# Patient Record
Sex: Male | Born: 1955 | Hispanic: No | Marital: Married | Smoking: Never smoker
Health system: Southern US, Community
[De-identification: ages and names within clinical notes are randomized; demographics above are authoritative.]

## PROBLEM LIST (undated history)

## (undated) DIAGNOSIS — I10 Essential (primary) hypertension: Secondary | ICD-10-CM

## (undated) DIAGNOSIS — I2510 Atherosclerotic heart disease of native coronary artery without angina pectoris: Secondary | ICD-10-CM

## (undated) DIAGNOSIS — E785 Hyperlipidemia, unspecified: Secondary | ICD-10-CM

## (undated) HISTORY — DX: Hyperlipidemia, unspecified: E78.5

## (undated) HISTORY — DX: Essential (primary) hypertension: I10

## (undated) HISTORY — DX: Atherosclerotic heart disease of native coronary artery without angina pectoris: I25.10

---

## 2011-09-02 DIAGNOSIS — I10 Essential (primary) hypertension: Secondary | ICD-10-CM | POA: Insufficient documentation

## 2015-07-22 DIAGNOSIS — M67472 Ganglion, left ankle and foot: Secondary | ICD-10-CM | POA: Insufficient documentation

## 2016-05-17 DIAGNOSIS — E876 Hypokalemia: Secondary | ICD-10-CM | POA: Insufficient documentation

## 2017-01-12 DIAGNOSIS — N529 Male erectile dysfunction, unspecified: Secondary | ICD-10-CM | POA: Insufficient documentation

## 2017-12-05 DIAGNOSIS — R972 Elevated prostate specific antigen [PSA]: Secondary | ICD-10-CM | POA: Insufficient documentation

## 2018-05-20 DIAGNOSIS — E782 Mixed hyperlipidemia: Secondary | ICD-10-CM | POA: Insufficient documentation

## 2019-04-28 ENCOUNTER — Other Ambulatory Visit: Payer: Self-pay

## 2019-04-28 ENCOUNTER — Encounter (HOSPITAL_COMMUNITY): Payer: Self-pay

## 2019-04-29 ENCOUNTER — Encounter (HOSPITAL_COMMUNITY): Payer: Self-pay | Admitting: Hematology

## 2019-04-29 ENCOUNTER — Inpatient Hospital Stay (HOSPITAL_COMMUNITY): Payer: 59

## 2019-04-29 ENCOUNTER — Inpatient Hospital Stay (HOSPITAL_COMMUNITY): Payer: 59 | Attending: Hematology | Admitting: Hematology

## 2019-04-29 VITALS — BP 160/85 | HR 86 | Temp 98.3°F | Resp 18 | Ht 66.0 in | Wt 181.5 lb

## 2019-04-29 DIAGNOSIS — D472 Monoclonal gammopathy: Secondary | ICD-10-CM | POA: Insufficient documentation

## 2019-04-29 DIAGNOSIS — E785 Hyperlipidemia, unspecified: Secondary | ICD-10-CM | POA: Insufficient documentation

## 2019-04-29 DIAGNOSIS — Z79899 Other long term (current) drug therapy: Secondary | ICD-10-CM | POA: Diagnosis not present

## 2019-04-29 DIAGNOSIS — I10 Essential (primary) hypertension: Secondary | ICD-10-CM | POA: Diagnosis not present

## 2019-04-29 LAB — HEPATITIS C ANTIBODY: HCV Ab: NONREACTIVE

## 2019-04-29 LAB — HEPATITIS B CORE ANTIBODY, TOTAL: Hep B Core Total Ab: NONREACTIVE

## 2019-04-29 LAB — HEPATITIS B SURFACE ANTIGEN: Hepatitis B Surface Ag: NONREACTIVE

## 2019-04-29 LAB — HEPATITIS B SURFACE ANTIBODY,QUALITATIVE: Hep B S Ab: NONREACTIVE

## 2019-04-29 LAB — LACTATE DEHYDROGENASE: LDH: 152 U/L (ref 98–192)

## 2019-04-29 NOTE — Assessment & Plan Note (Signed)
1.  Monoclonal gammopathy: -Recent blood work on 04/21/2019 by his PMD showed elevated total protein of 8.5. -SPEP was ordered which showed 2.08 g of M spike. -Kappa light chains were 8.78, lambda light chains are 261.42 with ratio of 0.03.  Calcium and creatinine were normal.  Hemoglobin was 14.8. -He denies any fevers, night sweats or weight loss in the last 6 months.  He reports upper arm pain on exercising. -He works as a Air traffic controller at [employer].  He had exposure to fixol and degreaser.  No pesticide exposure.  No family history of malignancies. -Given the M spike of 2 g, multiple myeloma should be ruled out.  We will order serum immunofixation to type monoclonal protein.  Will also order serum viscosity, LDH, beta-2 microglobulin. -We will send 24-hour urine for protein, UPEP and urine immunofixation. -I have also recommended bone marrow aspiration and biopsy.  We will also obtain a whole-body PET CT scan.

## 2019-04-29 NOTE — Patient Instructions (Signed)
[place] at [facility] Discharge Instructions  You were seen today by Dr. Delton Coombes. He went over your history, family history and how you've been feeling lately. He will schedule you for a Bone Marrow Biopsy. He will schedule you for a PET scan as well. He will have blood drawn today. He will see you back after your scan and biopsy for follow up.   Thank you for choosing [place] at [facility] to provide your oncology and hematology care.  To afford each patient quality time with our provider, please arrive at least 15 minutes before your scheduled appointment time.   If you have a lab appointment with the [place] please come in thru the  Main Entrance and check in at the main information desk  You need to re-schedule your appointment should you arrive 10 or more minutes late.  We strive to give you quality time with our providers, and arriving late affects you and other patients whose appointments are after yours.  Also, if you no show three or more times for appointments you may be dismissed from the clinic at the providers discretion.     Again, thank you for choosing [facility].  Our hope is that these requests will decrease the amount of time that you wait before being seen by our physicians.       _____________________________________________________________  Should you have questions after your visit to [facility], please contact our office at [phone] between the hours of 8:00 a.m. and 4:30 p.m.  Voicemails left after 4:00 p.m. will not be returned until the following business day.  For prescription refill requests, have your pharmacy contact our office and allow 72 hours.    Cancer Center Support Programs:   > Cancer Support Group  2nd Tuesday of the month 1pm-2pm, Journey Room

## 2019-04-29 NOTE — Progress Notes (Signed)
[facility] NOTE  No care team member to display  CHIEF COMPLAINTS/PURPOSE OF CONSULTATION:  Monoclonal gammopathy.  HISTORY OF PRESENTING ILLNESS:  Walter Perez 63 y.o. male is seen in consultation today at the request of Dr. Daron Offer for further work-up and management of plasma cell disorder.  Routine blood work on 04/21/2019 showed elevated total protein of 8.5.  This prompted to check for SPEP which showed 2.08 g of M spike.  Lambda light chains were elevated at 261.42 and kappa light chains were normal at 8.78.  Ratio was abnormal at 0.03.  Calcium was 9.5.,  Creatinine of 0.98 and a normal hemoglobin of 14.8.  He denies any fevers, night sweats or weight loss.  He reported upper arm pain on exercising.  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  Denies any recurrent infections.  Denies any new onset pains.  No family history of malignancies.  He reports work-related exposure to Theme park manager.  Denies any exposure to pesticides.  Reports household use of Roundup.  MEDICAL HISTORY:  Past Medical History:  Diagnosis Date  . Hyperlipidemia   . Hypertension     SURGICAL HISTORY: History reviewed. No pertinent surgical history.  SOCIAL HISTORY: Social History   Socioeconomic History  . Marital status: Married    Spouse name: Not on file  . Number of children: 2  . Years of education: Not on file  . Highest education level: Not on file  Occupational History  . Occupation: Unemployed  Social Needs  . Financial resource strain: Not on file  . Food insecurity    Worry: Not on file    Inability: Not on file  . Transportation needs    Medical: Not on file    Non-medical: Not on file  Tobacco Use  . Smoking status: Never Smoker  Substance and Sexual Activity  . Alcohol use: Never    Frequency: Never  . Drug use: Never  . Sexual activity: Yes  Lifestyle  . Physical activity    Days per week: Not on  file    Minutes per session: Not on file  . Stress: Not on file  Relationships  . Social Herbalist on phone: Not on file    Gets together: Not on file    Attends religious service: Not on file    Active member of club or organization: Not on file    Attends meetings of clubs or organizations: Not on file    Relationship status: Not on file  . Intimate partner violence    Fear of current or ex partner: Not on file    Emotionally abused: Not on file    Physically abused: Not on file    Forced sexual activity: Not on file  Other Topics Concern  . Not on file  Social History Narrative  . Not on file    FAMILY HISTORY: Family History  Problem Relation Age of Onset  . Stroke Mother   . Stroke Father     ALLERGIES:  has no allergies on file.  MEDICATIONS:  Current Outpatient Medications  Medication Sig Dispense Refill  . atorvastatin (LIPITOR) 80 MG tablet Take by mouth.    . olmesartan-hydrochlorothiazide (BENICAR HCT) 40-25 MG tablet Take 1 tablet by mouth once daily for high blood pressure    . sildenafil (VIAGRA) 100 MG tablet TAKE _0 TABLET BY MOUTH DAILY AS NEEDED FOR UP TO  30 DAYS FOR ERECTILE DYSFUNCTION. TAKE 1 HOUR BEFORE INTERCOURSE     No current facility-administered medications for this visit.     REVIEW OF SYSTEMS:   Constitutional: Denies fevers, chills or abnormal night sweats Eyes: Denies blurriness of vision, double vision or watery eyes Ears, nose, mouth, throat, and face: Denies mucositis or sore throat Respiratory: Denies cough, dyspnea or wheezes Cardiovascular: Denies palpitation, chest discomfort or lower extremity swelling Gastrointestinal:  Denies nausea, heartburn or change in bowel habits Skin: Denies abnormal skin rashes Lymphatics: Denies new lymphadenopathy or easy bruising Neurological:Denies numbness, tingling or new weaknesses Behavioral/Psych: Mood is stable, no new changes  All other systems were reviewed with the patient  and are negative.  PHYSICAL EXAMINATION: ECOG PERFORMANCE STATUS: 0 - Asymptomatic  Vitals:   04/29/19 0735  BP: (!) 160/85  Pulse: 86  Resp: 18  Temp: 98.3 F (36.8 C)  SpO2: 99%   Filed Weights   04/29/19 0735  Weight: 181 lb 8 oz (82.3 kg)    GENERAL:alert, no distress and comfortable SKIN: skin color, texture, turgor are normal, no rashes or significant lesions EYES: normal, conjunctiva are pink and non-injected, sclera clear OROPHARYNX:no exudate, no erythema and lips, buccal mucosa, and tongue normal  NECK: supple, thyroid normal size, non-tender, without nodularity LYMPH:  no palpable lymphadenopathy in the cervical, axillary or inguinal LUNGS: clear to auscultation and percussion with normal breathing effort HEART: regular rate & rhythm and no murmurs and no lower extremity edema ABDOMEN:abdomen soft, non-tender and normal bowel sounds Musculoskeletal:no cyanosis of digits and no clubbing  PSYCH: alert & oriented x 3 with fluent speech NEURO: no focal motor/sensory deficits  LABORATORY DATA:  I have reviewed the data as listed No results found for: WBC, HGB, HCT, MCV, PLT   Chemistry   No results found for: NA, K, CL, CO2, BUN, CREATININE, GLU No results found for: CALCIUM, ALKPHOS, AST, ALT, BILITOT     RADIOGRAPHIC STUDIES: I have personally reviewed the radiological images as listed and agreed with the findings in the report.  ASSESSMENT & PLAN:  Monoclonal gammopathy 1.  Monoclonal gammopathy: -Recent blood work on 04/21/2019 by his PMD showed elevated total protein of 8.5. -SPEP was ordered which showed 2.08 g of M spike. -Kappa light chains were 8.78, lambda light chains are 261.42 with ratio of 0.03.  Calcium and creatinine were normal.  Hemoglobin was 14.8. -He denies any fevers, night sweats or weight loss in the last 6 months.  He reports upper arm pain on exercising. 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  No pesticide exposure.  No family history of malignancies. -Given the M spike of 2 g, multiple myeloma should be ruled out.  We will order serum immunofixation to type monoclonal protein.  Will also order serum viscosity, LDH, beta-2 microglobulin. -We will send 24-hour urine for protein, UPEP and urine immunofixation. -I have also recommended bone marrow aspiration and biopsy.  We will also obtain a whole-body PET CT scan.  Orders Placed This Encounter  Procedures  . NM PET Image Initial (PI) Skull Base To Thigh    Standing Status:   Future    Standing Expiration Date:   04/28/2020    Order Specific Question:   If indicated for the ordered procedure, I authorize the administration of a radiopharmaceutical per Radiology protocol    Answer:   Yes    Order Specific Question:   Preferred imaging location?  Answer:   [facility]    Order Specific Question:   Radiology Contrast Protocol - do NOT remove file path    Answer:   _0 [email or web address]  . Beta 2 microglobulin, serum    Standing Status:   Future    Number of Occurrences:   1    Standing Expiration Date:   04/28/2020  . Immunofixation electrophoresis    Standing Status:   Future    Number of Occurrences:   1    Standing Expiration Date:   04/28/2020  . Hepatitis B surface antibody    Standing Status:   Future    Number of Occurrences:   1    Standing Expiration Date:   04/28/2020  . Lactate dehydrogenase    Standing Status:   Future    Number of Occurrences:   1    Standing Expiration Date:   04/28/2020  . Kappa/lambda light chains    Standing Status:   Future    Number of Occurrences:   1    Standing Expiration Date:   04/28/2020  . Viscosity, Serum    Standing Status:   Future    Number of Occurrences:   1    Standing Expiration Date:   04/28/2020  . 24 hr, Ur UPEP/UIFE/Light Chains/TP    Standing Status:   Future    Standing Expiration Date:   04/28/2020  . Hepatitis B core  antibody, total  . Hepatitis C Antibody  . Hepatitis B surface antigen    All questions were answered. The patient knows to call the clinic with any problems, questions or concerns.      Derek Jack, MD 04/29/2019 7:15 PM

## 2019-04-30 ENCOUNTER — Inpatient Hospital Stay (HOSPITAL_BASED_OUTPATIENT_CLINIC_OR_DEPARTMENT_OTHER): Payer: 59 | Admitting: Hematology

## 2019-04-30 ENCOUNTER — Other Ambulatory Visit: Payer: Self-pay

## 2019-04-30 VITALS — BP 161/94 | HR 69 | Temp 97.1°F | Resp 16

## 2019-04-30 DIAGNOSIS — D472 Monoclonal gammopathy: Secondary | ICD-10-CM | POA: Diagnosis not present

## 2019-04-30 LAB — CBC WITH DIFFERENTIAL/PLATELET
Abs Immature Granulocytes: 0.03 10*3/uL (ref 0.00–0.07)
Basophils Absolute: 0.1 10*3/uL (ref 0.0–0.1)
Basophils Relative: 1 %
Eosinophils Absolute: 0.4 10*3/uL (ref 0.0–0.5)
Eosinophils Relative: 6 %
HCT: 46.5 % (ref 39.0–52.0)
Hemoglobin: 14.9 g/dL (ref 13.0–17.0)
Immature Granulocytes: 1 %
Lymphocytes Relative: 19 %
Lymphs Abs: 1.2 10*3/uL (ref 0.7–4.0)
MCH: 28.4 pg (ref 26.0–34.0)
MCHC: 32 g/dL (ref 30.0–36.0)
MCV: 88.6 fL (ref 80.0–100.0)
Monocytes Absolute: 0.5 10*3/uL (ref 0.1–1.0)
Monocytes Relative: 7 %
Neutro Abs: 4.2 10*3/uL (ref 1.7–7.7)
Neutrophils Relative %: 66 %
Platelets: 222 10*3/uL (ref 150–400)
RBC: 5.25 MIL/uL (ref 4.22–5.81)
RDW: 12.5 % (ref 11.5–15.5)
WBC: 6.3 10*3/uL (ref 4.0–10.5)
nRBC: 0 % (ref 0.0–0.2)

## 2019-04-30 LAB — KAPPA/LAMBDA LIGHT CHAINS
Kappa free light chain: 8.5 mg/L (ref 3.3–19.4)
Kappa, lambda light chain ratio: 0.03 — ABNORMAL LOW (ref 0.26–1.65)
Lambda free light chains: 303.2 mg/L — ABNORMAL HIGH (ref 5.7–26.3)

## 2019-04-30 LAB — BETA 2 MICROGLOBULIN, SERUM: Beta-2 Microglobulin: 1.7 mg/L (ref 0.6–2.4)

## 2019-04-30 NOTE — Progress Notes (Signed)
Vitals taken post procedure. BP elevated , was elavated prior to procedure as well, patient stated he took his medications this morning. Encouraged pt to recheck it later and contact PCP is continues to stay elevated.   Dressing CDI, no swelling or erythema noted. Vitals stable and discharged home from clinic ambulatory. Follow up as scheduled.

## 2019-04-30 NOTE — Patient Instructions (Signed)
[place] at [facility] Discharge Instructions  You were seen today by Dr. Delton Coombes.You had a bone marrow biopsy today. He will see you back in 2 weeks for labs and follow up.   Bone Marrow Aspiration and Bone Marrow Biopsy, Adult, Care After This sheet gives you information about how to care for yourself after your procedure. Your health care provider may also give you more specific instructions. If you have problems or questions, contact your health care provider. What can I expect after the procedure? After the procedure, it is common to have:  Mild pain and tenderness.  Swelling.  Bruising. Follow these instructions at home: Puncture site care      Follow instructions from your health care provider about how to take care of the puncture site. Make sure you: ? Wash your hands with soap and water before you change your bandage (dressing). If soap and water are not available, use hand sanitizer. ? Change your dressing as told by your health care provider.  Check your puncture siteevery day for signs of infection. Check for: ? More redness, swelling, or pain. ? More fluid or blood. ? Warmth. ? Pus or a bad smell. General instructions  Take over-the-counter and prescription medicines only as told by your health care provider.  Do not take baths, swim, or use a hot tub until your health care provider approves. Ask if you can take a shower or have a sponge bath.  Return to your normal activities as told by your health care provider. Ask your health care provider what activities are safe for you.  Do not drive for 24 hours if you were given a medicine to help you relax (sedative) during your procedure.  Keep all follow-up visits as told by your health care provider. This is important. Contact a health care provider if:  Your pain is not controlled with medicine. Get help right away if:  You have a fever.  You have more redness, swelling, or  pain around the puncture site.  You have more fluid or blood coming from the puncture site.  Your puncture site feels warm to the touch.  You have pus or a bad smell coming from the puncture site. These symptoms may represent a serious problem that is an emergency. Do not wait to see if the symptoms will go away. Get medical help right away. Call your local emergency services (911 in the [place] Do not drive yourself to the hospital. Summary  After the procedure, it is common to have mild pain, tenderness, swelling, and bruising.  Follow instructions from your health care provider about how to take care of the puncture site.  Get help right away if you have any symptoms of infection or if you have more blood or fluid coming from the puncture site. This information is not intended to replace advice given to you by your health care provider. Make sure you discuss any questions you have with your health care provider. Document Released: 01/06/2005 Document Revised: 10/02/2017 Document Reviewed: 12/01/2015 Elsevier Patient Education  2020 [employer].   Thank you for choosing [place] at [facility] to provide your oncology and hematology care.  To afford each patient quality time with our provider, please arrive at least 15 minutes before your scheduled appointment time.   If you have a lab appointment with the [facility] please come in thru the  Main Entrance and check in at the main information desk  You need to re-schedule your appointment should you arrive 10 or more minutes late.  We strive to give you quality time with our providers, and arriving late affects you and other patients whose appointments are after yours.  Also, if you no show three or more times for appointments you may be dismissed from the clinic at the providers discretion.     Again, thank you for choosing [facility].  Our hope is that these requests will decrease the amount of  time that you wait before being seen by our physicians.       _____________________________________________________________  Should you have questions after your visit to [facility], please contact our office at [phone] between the hours of 8:00 a.m. and 4:30 p.m.  Voicemails left after 4:00 p.m. will not be returned until the following business day.  For prescription refill requests, have your pharmacy contact our office and allow 72 hours.    Cancer Center Support Programs:   > Cancer Support Group  2nd Tuesday of the month 1pm-2pm, Journey Room

## 2019-04-30 NOTE — Progress Notes (Signed)
Patient here today for bone marrow biopsy. Procedure explained and consent signed by all parties at 0810. Patient placed in prone position with both arms above head. Time out conducted at [place] all parties agreed. Procedure started at 0830. Patient tolerated procedure well with minimal pain and discomfort. Specimens collected and labeled appropriately. Procedure completed at [place]. Dressing applied and patient reposition on back, sitting up, resting at 0850. Specimens taken to lab for processing. Report given to Forest Gleason RN, patient care transferred.

## 2019-05-01 ENCOUNTER — Encounter (HOSPITAL_COMMUNITY): Payer: Self-pay | Admitting: Hematology

## 2019-05-01 LAB — IMMUNOFIXATION ELECTROPHORESIS
IgA: 58 mg/dL — ABNORMAL LOW (ref 61–437)
IgG (Immunoglobin G), Serum: 2460 mg/dL — ABNORMAL HIGH (ref 603–1613)
IgM (Immunoglobulin M), Srm: 32 mg/dL (ref 20–172)
Total Protein ELP: 8.3 g/dL (ref 6.0–8.5)

## 2019-05-01 LAB — VISCOSITY, SERUM: Viscosity, Serum: 1.8 rel.saline (ref 1.4–2.1)

## 2019-05-01 NOTE — Progress Notes (Signed)
INDICATION: Plasma cell neoplasm.   Bone Marrow Biopsy and Aspiration Procedure Note   The patient was identified by name and date of birth, prior to start of the procedure and a timeout was performed.   An informed consent was obtained after discussing potential risks including bleeding, infection and pain.  The right posterior iliac crest was palpated, cleaned with ChloraPrep, and drapes applied.  1% lidocaine is infiltrated into the skin, subcutaneous tissue and periosteum.  Bone marrow was aspirated and smears made.  With the help of Jamshidi needle a core biopsy was obtained.  Pressure was applied to the biopsy site and bandage was placed over the biopsy site. Patient was made to lie on the back for 15 mins prior to discharge.  The procedure was tolerated well. COMPLICATIONS: None BLOOD LOSS: none Patient was discharged home in stable condition to return in 2 weeks to review results.  Patient was provided with post bone marrow biopsy instructions and instructed to call if there was any bleeding or worsening pain.  Specimens sent for flow cytometry, cytogenetics and additional studies.  Signed Derek Jack, MD

## 2019-05-02 LAB — SURGICAL PATHOLOGY

## 2019-05-05 LAB — UPEP/UIFE/LIGHT CHAINS/TP, 24-HR UR
% BETA, Urine: 0 %
ALPHA 1 URINE: 0 %
Albumin, U: 0 %
Alpha 2, Urine: 0 %
Free Kappa Lt Chains,Ur: 1.13 mg/L (ref 0.63–113.79)
Free Kappa/Lambda Ratio: >1.77 (ref 1.03–31.76)
Free Lambda Lt Chains,Ur: <0.64 mg/L (ref 0.47–11.77)
GAMMA GLOBULIN URINE: 0 %
Total Protein, Urine-Ur/day: <95 mg/(24.h) (ref 30–150)
Total Protein, Urine: <4 mg/dL
Total Volume: 2375

## 2019-05-08 ENCOUNTER — Encounter (HOSPITAL_COMMUNITY): Payer: 59

## 2019-05-08 ENCOUNTER — Other Ambulatory Visit (HOSPITAL_COMMUNITY): Payer: Self-pay | Admitting: Nurse Practitioner

## 2019-05-08 ENCOUNTER — Encounter (HOSPITAL_COMMUNITY): Payer: Self-pay

## 2019-05-08 DIAGNOSIS — D472 Monoclonal gammopathy: Secondary | ICD-10-CM

## 2019-05-09 ENCOUNTER — Encounter (HOSPITAL_COMMUNITY): Payer: Self-pay | Admitting: Hematology

## 2019-05-09 ENCOUNTER — Ambulatory Visit (HOSPITAL_COMMUNITY)
Admission: RE | Admit: 2019-05-09 | Discharge: 2019-05-09 | Disposition: A | Discharge status: Home / Self Care | Payer: Self-pay | Source: Ambulatory Visit | Attending: Nurse Practitioner | Admitting: Nurse Practitioner

## 2019-05-09 ENCOUNTER — Other Ambulatory Visit: Payer: Self-pay

## 2019-05-09 DIAGNOSIS — D472 Monoclonal gammopathy: Secondary | ICD-10-CM | POA: Insufficient documentation

## 2019-05-12 ENCOUNTER — Encounter (HOSPITAL_COMMUNITY): Payer: Self-pay | Admitting: Hematology

## 2019-05-14 ENCOUNTER — Inpatient Hospital Stay (HOSPITAL_COMMUNITY): Payer: 59 | Attending: Hematology | Admitting: Hematology

## 2019-05-14 ENCOUNTER — Other Ambulatory Visit: Payer: Self-pay

## 2019-05-14 ENCOUNTER — Ambulatory Visit (HOSPITAL_COMMUNITY): Payer: Self-pay | Admitting: Hematology

## 2019-05-14 ENCOUNTER — Encounter (HOSPITAL_COMMUNITY): Payer: Self-pay | Admitting: Hematology

## 2019-05-14 DIAGNOSIS — Z79899 Other long term (current) drug therapy: Secondary | ICD-10-CM | POA: Diagnosis not present

## 2019-05-14 DIAGNOSIS — D472 Monoclonal gammopathy: Secondary | ICD-10-CM | POA: Diagnosis not present

## 2019-05-14 NOTE — Assessment & Plan Note (Addendum)
1.  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 -Lambda light chains are 303, ratio of 0.03.  LDH normal.  Beta-2 microglobulin 1.7.  Serum viscosity normal.  Hemoglobin normal.  Calcium and creatinine were normal. -Bone marrow biopsy on 04/30/2019 showed atypical plasma cells representing 12% in the aspirate.  Plasma cells display lambda light chain restriction. -Myeloma FISH panel shows gain of 1 q..  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 -24-hour urine was negative for immunofixation and UPEP.  Urine total protein was undetectable. -Skeletal survey on 05/09/2019 did not show any evidence of lytic lesions.  His insurance refused PET CT scan. -Based on Mayo 2018 risk stratification system, his M protein is more than 2 g/dL and involved/uninvolved free light chain ratio was more than 20.  However bone marrow plasma cells are less than 20%.  With 2 risk factors present, he will be considered high risk with estimated median time to progression of 29 months, estimated risk of progression of 24 %/year during the first 2 years, 11 %/year for the next 3 years, 3 %/year for the next 5 years. -I have recommended treatment with Revlimid which is a category 2B recommendation.  We also talked about exploring clinical trials at the closest academic institutions for high risk smoldering myeloma. -I have also strongly recommended a PET CT scan which is more sensitive than traditional skeletal survey.  We will order the PET CT scan at this time.

## 2019-05-14 NOTE — Patient Instructions (Signed)
[place] at [facility] Discharge Instructions  You were seen today by Dr. Delton Coombes. He went over your recent test results. Your test results show that you have smoldering myeloma, which is considered pre cancer. You can look on the [facility] website for great information on this. He will see you back in 2 months for labs and follow up.   Thank you for choosing [place] at [facility] to provide your oncology and hematology care.  To afford each patient quality time with our provider, please arrive at least 15 minutes before your scheduled appointment time.   If you have a lab appointment with the [facility] please come in thru the  Main Entrance and check in at the main information desk  You need to re-schedule your appointment should you arrive 10 or more minutes late.  We strive to give you quality time with our providers, and arriving late affects you and other patients whose appointments are after yours.  Also, if you no show three or more times for appointments you may be dismissed from the clinic at the providers discretion.     Again, thank you for choosing [facility].  Our hope is that these requests will decrease the amount of time that you wait before being seen by our physicians.       _____________________________________________________________  Should you have questions after your visit to [facility], please contact our office at [phone] between the hours of 8:00 a.m. and 4:30 p.m.  Voicemails left after 4:00 p.m. will not be returned until the following business day.  For prescription refill requests, have your pharmacy contact our office and allow 72 hours.    Cancer Center Support Programs:   > Cancer Support Group  2nd Tuesday of the month 1pm-2pm, Journey Room

## 2019-05-14 NOTE — Progress Notes (Signed)
[place], [place] [address]   CLINIC:  Medical Oncology/Hematology  PCP:  Patient, No Pcp Per No address on file None   REASON FOR VISIT:  Follow-up for smoldering multiple myeloma.  CURRENT THERAPY: Observation.   INTERVAL HISTORY:  Walter Perez 63 y.o. male seen for follow-up of plasma cell disorder.  He underwent bone marrow biopsy on 04/30/2019.  We have ordered PET scan which was not approved by his insurance.  He underwent skeletal survey.  Denies any new onset pains.  Denies any fevers, night sweats or weight loss.  He apparently joined a new job 2 days ago.  Appetite is 100%.  Energy levels are 50%.  No fevers, night sweats or weight loss reported.    REVIEW OF SYSTEMS:  Review of Systems  All other systems reviewed and are negative.    PAST MEDICAL/SURGICAL HISTORY:  Past Medical History:  Diagnosis Date  . Hyperlipidemia   . Hypertension    No past surgical history on file.   SOCIAL HISTORY:  Social History   Socioeconomic History  . Marital status: Married    Spouse name: Not on file  . Number of children: 2  . Years of education: Not on file  . Highest education level: Not on file  Occupational History  . Occupation: Unemployed  Social Needs  . Financial resource strain: Not on file  . Food insecurity    Worry: Not on file    Inability: Not on file  . Transportation needs    Medical: Not on file    Non-medical: Not on file  Tobacco Use  . Smoking status: Never Smoker  Substance and Sexual Activity  . Alcohol use: Never    Frequency: Never  . Drug use: Never  . Sexual activity: Yes  Lifestyle  . Physical activity    Days per week: Not on file    Minutes per session: Not on file  . Stress: Not on file  Relationships  . Social Herbalist on phone: Not on file    Gets together: Not on file    Attends religious service: Not on file    Active member of club or organization: Not on file   Attends meetings of clubs or organizations: Not on file    Relationship status: Not on file  . Intimate partner violence    Fear of current or ex partner: Not on file    Emotionally abused: Not on file    Physically abused: Not on file    Forced sexual activity: Not on file  Other Topics Concern  . Not on file  Social History Narrative  . Not on file    FAMILY HISTORY:  Family History  Problem Relation Age of Onset  . Stroke Mother   . Stroke Father     CURRENT MEDICATIONS:  Outpatient Encounter Medications as of 05/14/2019  Medication Sig  . atorvastatin (LIPITOR) 80 MG tablet Take 80 mg by mouth daily at 6 PM.   . olmesartan-hydrochlorothiazide (BENICAR HCT) 40-25 MG tablet Take 1 tablet by mouth once daily for high blood pressure  . sildenafil (VIAGRA) 100 MG tablet TAKE _0 TABLET BY MOUTH DAILY AS NEEDED FOR UP TO 30 DAYS FOR ERECTILE DYSFUNCTION. TAKE 1 HOUR BEFORE INTERCOURSE   No facility-administered encounter medications on file as of 05/14/2019.     ALLERGIES:  No Known Allergies   PHYSICAL EXAM:  ECOG Performance status: 0  Vitals:   05/14/19 1101  BP: (!) 152/87  Pulse: 76  Resp: 14  Temp: (!) 97.5 F (36.4 C)  SpO2: 100%   Filed Weights   05/14/19 1101  Weight: 181 lb (82.1 kg)    Physical Exam Vitals signs reviewed.  Constitutional:      Appearance: Normal appearance.  Cardiovascular:     Rate and Rhythm: Normal rate and regular rhythm.  Pulmonary:     Effort: Pulmonary effort is normal.     Breath sounds: Normal breath sounds.  Abdominal:     General: There is no distension.     Palpations: Abdomen is soft. There is no mass.  Musculoskeletal:        General: No swelling.  Skin:    General: Skin is warm.  Neurological:     General: No focal deficit present.     Mental Status: He is alert and oriented to person, place, and time.  Psychiatric:        Mood and Affect: Mood normal.        Behavior: Behavior normal.       LABORATORY DATA:  I have reviewed the labs as listed.  CBC    Component Value Date/Time   WBC 6.3 04/30/2019 0804   RBC 5.25 04/30/2019 0804   HGB 14.9 04/30/2019 0804   HCT 46.5 04/30/2019 0804   PLT 222 04/30/2019 0804   MCV 88.6 04/30/2019 0804   MCH 28.4 04/30/2019 0804   MCHC 32.0 04/30/2019 0804   RDW 12.5 04/30/2019 0804   LYMPHSABS 1.2 04/30/2019 0804   MONOABS 0.5 04/30/2019 0804   EOSABS 0.4 04/30/2019 0804   BASOSABS 0.1 04/30/2019 0804   No flowsheet data found.     DIAGNOSTIC IMAGING:  I have independently reviewed the scans and discussed with the patient.    ASSESSMENT & PLAN:   Monoclonal gammopathy 1.  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 -Lambda light chains are 303, ratio of 0.03.  LDH normal.  Beta-2 microglobulin 1.7.  Serum viscosity normal.  Hemoglobin normal.  Calcium and creatinine were normal. -Bone marrow biopsy on 04/30/2019 showed atypical plasma cells representing 12% in the aspirate.  Plasma cells display lambda light chain restriction. -Myeloma FISH panel shows gain of 1 q..  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 -24-hour urine was negative for immunofixation and UPEP.  Urine total protein was undetectable. -Skeletal survey on 05/09/2019 did not show any evidence of lytic lesions.  His insurance refused PET CT scan. -Based on Mayo 2018 risk stratification system, his M protein is more than 2 g/dL and involved/uninvolved free light chain ratio was more than 20.  However bone marrow plasma cells are less than 20%.  With 2 risk factors present, he will be considered high risk with estimated median time to progression of 29 months, estimated risk of progression of 24 %/year during the first 2 years, 11 %/year for the next 3 years, 3 %/year for the next 5 years. -I have recommended treatment with Revlimid which is a category 2B recommendation.  We also talked about exploring clinical trials at the closest  academic institutions for high risk smoldering myeloma. -I have also strongly recommended a PET CT scan which is more sensitive than traditional skeletal survey.  We will order the PET CT scan at this time.   Total time spent is 25 minutes with more than 50% of the time spent face-to-face discussing new diagnosis, bone marrow biopsy  results, prognosis, counseling and coordination of care.  Orders placed this encounter:  Orders Placed This Encounter  Procedures  . NM PET Image Initial (PI) Skull Base To Thigh  . CBC with Differential/Platelet  . Comprehensive metabolic panel  . Protein electrophoresis, serum  . Kappa/lambda light chains  . Lactate dehydrogenase      Derek Jack, MD [place] [phone]

## 2019-05-19 ENCOUNTER — Other Ambulatory Visit (HOSPITAL_COMMUNITY): Payer: Self-pay | Admitting: Nurse Practitioner

## 2019-05-19 DIAGNOSIS — D472 Monoclonal gammopathy: Secondary | ICD-10-CM

## 2019-05-23 ENCOUNTER — Ambulatory Visit (HOSPITAL_COMMUNITY): Payer: 59

## 2019-05-27 ENCOUNTER — Other Ambulatory Visit (HOSPITAL_COMMUNITY): Payer: Self-pay | Admitting: *Deleted

## 2019-05-27 DIAGNOSIS — D472 Monoclonal gammopathy: Secondary | ICD-10-CM

## 2019-05-27 DIAGNOSIS — C9000 Multiple myeloma not having achieved remission: Secondary | ICD-10-CM

## 2019-06-10 ENCOUNTER — Ambulatory Visit (HOSPITAL_COMMUNITY): Payer: 59

## 2019-06-12 DIAGNOSIS — I2510 Atherosclerotic heart disease of native coronary artery without angina pectoris: Secondary | ICD-10-CM | POA: Insufficient documentation

## 2019-06-18 ENCOUNTER — Other Ambulatory Visit: Payer: Self-pay

## 2019-06-18 ENCOUNTER — Ambulatory Visit (HOSPITAL_COMMUNITY)
Admission: RE | Admit: 2019-06-18 | Discharge: 2019-06-18 | Disposition: A | Discharge status: Home / Self Care | Payer: Managed Care, Other (non HMO) | Source: Ambulatory Visit | Attending: Hematology | Admitting: Hematology

## 2019-06-18 ENCOUNTER — Ambulatory Visit (HOSPITAL_COMMUNITY)
Admission: RE | Admit: 2019-06-18 | Discharge: 2019-06-18 | Disposition: A | Discharge status: Home / Self Care | Payer: Managed Care, Other (non HMO) | Source: Ambulatory Visit | Attending: Nurse Practitioner | Admitting: Nurse Practitioner

## 2019-06-18 DIAGNOSIS — D472 Monoclonal gammopathy: Secondary | ICD-10-CM

## 2019-06-18 DIAGNOSIS — C9000 Multiple myeloma not having achieved remission: Secondary | ICD-10-CM

## 2019-06-18 MED ORDER — GADOBUTROL 1 MMOL/ML IV SOLN
7.5000 mL | Freq: Once | INTRAVENOUS | Status: AC | PRN
  Administered 2019-06-18: 16:00:00 7.5 mL via INTRAVENOUS

## 2019-06-19 DIAGNOSIS — I219 Acute myocardial infarction, unspecified: Secondary | ICD-10-CM

## 2019-06-19 HISTORY — PX: CORONARY ARTERY BYPASS GRAFT: SHX141

## 2019-06-19 HISTORY — DX: Acute myocardial infarction, unspecified: I21.9

## 2019-06-30 ENCOUNTER — Encounter (HOSPITAL_COMMUNITY): Payer: Self-pay | Admitting: *Deleted

## 2019-06-30 NOTE — Progress Notes (Addendum)
Received Referral notification from Dr. 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  Reviewed medical history in [place] and epic.  [place] Referral form sent to Dr. Clementeen Graham along with request for most recent 12 lead ekg.  Pt has upcoming follow up appt. Pt will see his PCP on 1/5, Oncologist 1/11, heart surgeon 1/20 and Cardiology on 1/128.  Pt will need to complete his follow up appt satisfactorily and we received the requested documents.  Will have support staff contact pt regarding our referral process and virtual cardiac rehab. Once follow up appt are completed and reviewed, oncology clearance- pt can be scheduled for his initial assessment. Cherre Huger, BSN Cardiac and Training and development officer

## 2019-07-07 ENCOUNTER — Other Ambulatory Visit: Payer: Self-pay

## 2019-07-07 ENCOUNTER — Inpatient Hospital Stay (HOSPITAL_COMMUNITY): Payer: Managed Care, Other (non HMO) | Attending: Hematology

## 2019-07-07 DIAGNOSIS — Z79899 Other long term (current) drug therapy: Secondary | ICD-10-CM | POA: Diagnosis not present

## 2019-07-07 DIAGNOSIS — E785 Hyperlipidemia, unspecified: Secondary | ICD-10-CM | POA: Insufficient documentation

## 2019-07-07 DIAGNOSIS — D472 Monoclonal gammopathy: Secondary | ICD-10-CM | POA: Insufficient documentation

## 2019-07-07 DIAGNOSIS — Z951 Presence of aortocoronary bypass graft: Secondary | ICD-10-CM | POA: Insufficient documentation

## 2019-07-07 DIAGNOSIS — I10 Essential (primary) hypertension: Secondary | ICD-10-CM | POA: Insufficient documentation

## 2019-07-07 DIAGNOSIS — Z7982 Long term (current) use of aspirin: Secondary | ICD-10-CM | POA: Diagnosis not present

## 2019-07-07 DIAGNOSIS — I2510 Atherosclerotic heart disease of native coronary artery without angina pectoris: Secondary | ICD-10-CM | POA: Diagnosis not present

## 2019-07-07 LAB — CBC WITH DIFFERENTIAL/PLATELET
Abs Immature Granulocytes: 0.03 10*3/uL (ref 0.00–0.07)
Basophils Absolute: 0 10*3/uL (ref 0.0–0.1)
Basophils Relative: 1 %
Eosinophils Absolute: 0.4 10*3/uL (ref 0.0–0.5)
Eosinophils Relative: 6 %
HCT: 38.6 % — ABNORMAL LOW (ref 39.0–52.0)
Hemoglobin: 12 g/dL — ABNORMAL LOW (ref 13.0–17.0)
Immature Granulocytes: 0 %
Lymphocytes Relative: 12 %
Lymphs Abs: 0.9 10*3/uL (ref 0.7–4.0)
MCH: 28.6 pg (ref 26.0–34.0)
MCHC: 31.1 g/dL (ref 30.0–36.0)
MCV: 92.1 fL (ref 80.0–100.0)
Monocytes Absolute: 0.6 10*3/uL (ref 0.1–1.0)
Monocytes Relative: 7 %
Neutro Abs: 5.7 10*3/uL (ref 1.7–7.7)
Neutrophils Relative %: 74 %
Platelets: 313 10*3/uL (ref 150–400)
RBC: 4.19 MIL/uL — ABNORMAL LOW (ref 4.22–5.81)
RDW: 14 % (ref 11.5–15.5)
WBC: 7.8 10*3/uL (ref 4.0–10.5)
nRBC: 0 % (ref 0.0–0.2)

## 2019-07-07 LAB — COMPREHENSIVE METABOLIC PANEL
ALT: 29 U/L (ref 0–44)
AST: 15 U/L (ref 15–41)
Albumin: 3.8 g/dL (ref 3.5–5.0)
Alkaline Phosphatase: 78 U/L (ref 38–126)
Anion gap: 9 (ref 5–15)
BUN: 14 mg/dL (ref 8–23)
CO2: 26 mmol/L (ref 22–32)
Calcium: 8.6 mg/dL — ABNORMAL LOW (ref 8.9–10.3)
Chloride: 101 mmol/L (ref 98–111)
Creatinine, Ser: 1.08 mg/dL (ref 0.61–1.24)
GFR calc Af Amer: >60 mL/min (ref >60)
GFR calc non Af Amer: >60 mL/min (ref >60)
Glucose, Bld: 99 mg/dL (ref 70–99)
Potassium: 3.9 mmol/L (ref 3.5–5.1)
Sodium: 136 mmol/L (ref 135–145)
Total Bilirubin: 0.8 mg/dL (ref 0.3–1.2)
Total Protein: 8 g/dL (ref 6.5–8.1)

## 2019-07-07 LAB — LACTATE DEHYDROGENASE: LDH: 171 U/L (ref 98–192)

## 2019-07-08 LAB — KAPPA/LAMBDA LIGHT CHAINS
Kappa free light chain: 9.3 mg/L (ref 3.3–19.4)
Kappa, lambda light chain ratio: 0.03 — ABNORMAL LOW (ref 0.26–1.65)
Lambda free light chains: 268.6 mg/L — ABNORMAL HIGH (ref 5.7–26.3)

## 2019-07-14 ENCOUNTER — Encounter (HOSPITAL_COMMUNITY): Payer: Self-pay | Admitting: Hematology

## 2019-07-14 ENCOUNTER — Inpatient Hospital Stay (HOSPITAL_BASED_OUTPATIENT_CLINIC_OR_DEPARTMENT_OTHER): Payer: Managed Care, Other (non HMO) | Admitting: Hematology

## 2019-07-14 ENCOUNTER — Other Ambulatory Visit: Payer: Self-pay

## 2019-07-14 VITALS — BP 153/75 | HR 63 | Temp 97.1°F | Resp 18 | Wt 185.3 lb

## 2019-07-14 DIAGNOSIS — C9000 Multiple myeloma not having achieved remission: Secondary | ICD-10-CM | POA: Diagnosis not present

## 2019-07-14 DIAGNOSIS — D472 Monoclonal gammopathy: Secondary | ICD-10-CM

## 2019-07-14 NOTE — Patient Instructions (Signed)
[place] at [facility] Discharge Instructions  You were seen today by Dr. Delton Coombes. He went over your recent lab results. If you start having any bone pain please call the clinic for an appointment. He will see you back in 3 months for labs and follow up.   Thank you for choosing [place] at [facility] to provide your oncology and hematology care.  To afford each patient quality time with our provider, please arrive at least 15 minutes before your scheduled appointment time.   If you have a lab appointment with the [place] [facility] please come in thru the  Main Entrance and check in at the main information desk  You need to re-schedule your appointment should you arrive 10 or more minutes late.  We strive to give you quality time with our providers, and arriving late affects you and other patients whose appointments are after yours.  Also, if you no show three or more times for appointments you may be dismissed from the clinic at the providers discretion.     Again, thank you for choosing [facility].  Our hope is that these requests will decrease the amount of time that you wait before being seen by our physicians.       _____________________________________________________________  Should you have questions after your visit to [facility], please contact our office at [phone] between the hours of 8:00 a.m. and 4:30 p.m.  Voicemails left after 4:00 p.m. will not be returned until the following business day.  For prescription refill requests, have your pharmacy contact our office and allow 72 hours.    Cancer Center Support Programs:   > Cancer Support Group  2nd Tuesday of the month 1pm-2pm, Journey Room

## 2019-07-14 NOTE — Progress Notes (Signed)
[place], [place] [address]   CLINIC:  Medical Oncology/Hematology  PCP:  Patient, No Pcp Per No address on file None   REASON FOR VISIT:  Follow-up for smoldering multiple myeloma.  CURRENT THERAPY: Observation.   INTERVAL HISTORY:  Walter Perez 64 y.o. male seen for follow-up of smoldering myeloma.  He reportedly underwent CABG on 06/19/2019 and was discharged from [facility] on 06/24/2019.  He experienced tightness in both arms when he was exerting as his only sole symptom for CAD.  Appetite is 100%.  Energy levels are 75%.  No pain reported.    REVIEW OF SYSTEMS:  Review of Systems  All other systems reviewed and are negative.    PAST MEDICAL/SURGICAL HISTORY:  Past Medical History:  Diagnosis Date  . Hyperlipidemia   . Hypertension    History reviewed. No pertinent surgical history.   SOCIAL HISTORY:  Social History   Socioeconomic History  . Marital status: Married    Spouse name: Not on file  . Number of children: 2  . Years of education: Not on file  . Highest education level: Not on file  Occupational History  . Occupation: Unemployed  Tobacco Use  . Smoking status: Never Smoker  Substance and Sexual Activity  . Alcohol use: Never  . Drug use: Never  . Sexual activity: Yes  Other Topics Concern  . Not on file  Social History Narrative  . Not on file   Social Determinants of Health   Financial Resource Strain:   . Difficulty of Paying Living Expenses: Not on file  Food Insecurity:   . Worried About Charity fundraiser in the Last Year: Not on file  . Ran Out of Food in the Last Year: Not on file  Transportation Needs:   . Lack of Transportation (Medical): Not on file  . Lack of Transportation (Non-Medical): Not on file  Physical Activity:   . Days of Exercise per Week: Not on file  . Minutes of Exercise per Session: Not on file  Stress:   . Feeling of Stress : Not on file  Social  Connections:   . Frequency of Communication with Friends and Family: Not on file  . Frequency of Social Gatherings with Friends and Family: Not on file  . Attends Religious Services: Not on file  . Active Member of Clubs or Organizations: Not on file  . Attends Archivist Meetings: Not on file  . Marital Status: Not on file  Intimate Partner Violence:   . Fear of Current or Ex-Partner: Not on file  . Emotionally Abused: Not on file  . Physically Abused: Not on file  . Sexually Abused: Not on file    FAMILY HISTORY:  Family History  Problem Relation Age of Onset  . Stroke Mother   . Stroke Father     CURRENT MEDICATIONS:  Outpatient Encounter Medications as of 07/14/2019  Medication Sig  . amiodarone (PACERONE) 200 MG tablet Take 200 mg by mouth daily.  Marland Kitchen aspirin 81 MG EC tablet Take by mouth.  Marland Kitchen atorvastatin (LIPITOR) 80 MG tablet Take 80 mg by mouth daily at 6 PM.   . clopidogrel (PLAVIX) 75 MG tablet Take 75 mg by mouth daily.   . metoprolol tartrate (LOPRESSOR) 25 MG tablet Take by mouth.  . potassium chloride SA (KLOR-CON) 20 MEQ tablet Take 20 mEq by mouth daily.  . [DISCONTINUED] olmesartan-hydrochlorothiazide (BENICAR HCT) 40-25 MG tablet Take 1 tablet  by mouth once daily for high blood pressure  . [DISCONTINUED] sildenafil (VIAGRA) 100 MG tablet TAKE 1 2 1  TABLET BY MOUTH DAILY AS NEEDED FOR UP TO 30 DAYS FOR ERECTILE DYSFUNCTION. TAKE 1 HOUR BEFORE INTERCOURSE   No facility-administered encounter medications on file as of 07/14/2019.    ALLERGIES:  No Known Allergies   PHYSICAL EXAM:  ECOG Performance status: 0  Vitals:   07/14/19 1515  BP: (!) 153/75  Pulse: 63  Resp: 18  Temp: (!) 97.1 F (36.2 C)  SpO2: 100%   Filed Weights   07/14/19 1515  Weight: 185 lb 4.8 oz (84.1 kg)    Physical Exam Vitals reviewed.  Constitutional:      Appearance: Normal appearance.  Cardiovascular:     Rate and Rhythm: Normal rate and regular rhythm.    Pulmonary:     Effort: Pulmonary effort is normal.     Breath sounds: Normal breath sounds.  Abdominal:     General: There is no distension.     Palpations: Abdomen is soft. There is no mass.  Musculoskeletal:        General: No swelling.  Skin:    General: Skin is warm.  Neurological:     General: No focal deficit present.     Mental Status: He is alert and oriented to person, place, and time.  Psychiatric:        Mood and Affect: Mood normal.        Behavior: Behavior normal.      LABORATORY DATA:  I have reviewed the labs as listed.  CBC    Component Value Date/Time   WBC 7.8 07/07/2019 1223   RBC 4.19 (L) 07/07/2019 1223   HGB 12.0 (L) 07/07/2019 1223   HCT 38.6 (L) 07/07/2019 1223   PLT 313 07/07/2019 1223   MCV 92.1 07/07/2019 1223   MCH 28.6 07/07/2019 1223   MCHC 31.1 07/07/2019 1223   RDW 14.0 07/07/2019 1223   LYMPHSABS 0.9 07/07/2019 1223   MONOABS 0.6 07/07/2019 1223   EOSABS 0.4 07/07/2019 1223   BASOSABS 0.0 07/07/2019 1223   CMP Latest Ref Rng & Units 07/07/2019  Glucose 70 - 99 mg/dL 99  BUN 8 - 23 mg/dL 14  Creatinine 0.61 - 1.24 mg/dL 1.08  Sodium 135 - 145 mmol/L 136  Potassium 3.5 - 5.1 mmol/L 3.9  Chloride 98 - 111 mmol/L 101  CO2 22 - 32 mmol/L 26  Calcium 8.9 - 10.3 mg/dL 8.6(L)  Total Protein 6.5 - 8.1 g/dL 8.0  Total Bilirubin 0.3 - 1.2 mg/dL 0.8  Alkaline Phos 38 - 126 U/L 78  AST 15 - 41 U/L 15  ALT 0 - 44 U/L 29       DIAGNOSTIC IMAGING:  I have independently reviewed the scans and discussed with the patient.    ASSESSMENT & PLAN:   Monoclonal gammopathy 1.  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 -Lambda light chains are 303, ratio of 0.03.  LDH normal.  Beta-2 microglobulin 1.7.  Serum viscosity normal.  Hemoglobin normal.  Calcium and creatinine were normal. -Bone marrow biopsy on 04/30/2019 showed atypical plasma cells representing 12% in the aspirate.  Plasma  cells display lambda light chain restriction. -Myeloma FISH panel shows gain of 1 q..  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 -24-hour urine was negative for immunofixation and UPEP.  Urine total protein was undetectable. -Skeletal survey on 05/09/2019 did not show any evidence of  lytic lesions.  His insurance refused PET CT scan. -Based on Mayo 2018 risk stratification system, his M protein is more than 2 g/dL and involved/uninvolved free light chain ratio was more than 20.  However bone marrow plasma cells are less than 20%.  With 2 risk factors present, he will be considered high risk with estimated median time to progression of 29 months, estimated risk of progression of 24 %/year during the first 2 years, 11 %/year for the next 3 years, 3 %/year for the next 5 years. -We reviewed MRI of the thoracic, lumbar spine and pelvis with and without contrast on 06/18/2019 which did not show any evidence of myeloma. -We reviewed his blood work from 07/07/2019.  Creatinine and calcium are normal.  Free light chain ratio is stable.  Lambda light chains have slightly improved to 268.  M spike is pending at this time.  Hemoglobin decreased to 12.  However he reports that he was hospitalized from 06/19/2019 through 06/24/2019 and underwent CABG at [facility]. -I have reached out to [facility] for any clinical trials.  They do not have any at this time. -I have recommended close follow-up in 3 months with repeat labs.  He was instructed to come back sooner should he develop any skeletal pain.    Orders placed this encounter:  Orders Placed This Encounter  Procedures  . CBC with Differential/Platelet  . Comprehensive metabolic panel  . Protein electrophoresis, serum  . Kappa/lambda light chains  . Lactate dehydrogenase  . Lactate dehydrogenase      Derek Jack, MD [place] [phone]

## 2019-07-14 NOTE — Assessment & Plan Note (Addendum)
1.  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 -Lambda light chains are 303, ratio of 0.03.  LDH normal.  Beta-2 microglobulin 1.7.  Serum viscosity normal.  Hemoglobin normal.  Calcium and creatinine were normal. -Bone marrow biopsy on 04/30/2019 showed atypical plasma cells representing 12% in the aspirate.  Plasma cells display lambda light chain restriction. -Myeloma FISH panel shows gain of 1 q..  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 -24-hour urine was negative for immunofixation and UPEP.  Urine total protein was undetectable. -Skeletal survey on 05/09/2019 did not show any evidence of lytic lesions.  His insurance refused PET CT scan. -Based on Mayo 2018 risk stratification system, his M protein is more than 2 g/dL and involved/uninvolved free light chain ratio was more than 20.  However bone marrow plasma cells are less than 20%.  With 2 risk factors present, he will be considered high risk with estimated median time to progression of 29 months, estimated risk of progression of 24 %/year during the first 2 years, 11 %/year for the next 3 years, 3 %/year for the next 5 years. -We reviewed MRI of the thoracic, lumbar spine and pelvis with and without contrast on 06/18/2019 which did not show any evidence of myeloma. -We reviewed his blood work from 07/07/2019.  Creatinine and calcium are normal.  Free light chain ratio is stable.  Lambda light chains have slightly improved to 268.  M spike is pending at this time.  Hemoglobin decreased to 12.  However he reports that he was hospitalized from 06/19/2019 through 06/24/2019 and underwent CABG at [facility]. -I have reached out to [facility] for any clinical trials.  They do not have any at this time. -I have recommended close follow-up in 3 months with repeat labs.  He was instructed to come back sooner should he develop any skeletal pain.

## 2019-07-20 DIAGNOSIS — Z951 Presence of aortocoronary bypass graft: Secondary | ICD-10-CM | POA: Insufficient documentation

## 2019-07-24 ENCOUNTER — Other Ambulatory Visit (HOSPITAL_COMMUNITY): Payer: Self-pay | Admitting: *Deleted

## 2019-07-24 DIAGNOSIS — C9000 Multiple myeloma not having achieved remission: Secondary | ICD-10-CM

## 2019-07-24 DIAGNOSIS — D472 Monoclonal gammopathy: Secondary | ICD-10-CM

## 2019-07-24 NOTE — Progress Notes (Signed)
SPEP added because outside lab did not draw.

## 2019-07-25 ENCOUNTER — Other Ambulatory Visit: Payer: Self-pay

## 2019-07-25 ENCOUNTER — Inpatient Hospital Stay (HOSPITAL_COMMUNITY): Payer: Managed Care, Other (non HMO)

## 2019-07-25 DIAGNOSIS — D472 Monoclonal gammopathy: Secondary | ICD-10-CM | POA: Diagnosis not present

## 2019-07-25 DIAGNOSIS — C9000 Multiple myeloma not having achieved remission: Secondary | ICD-10-CM

## 2019-07-28 LAB — PROTEIN ELECTROPHORESIS, SERUM
A/G Ratio: 1.1 (ref 0.7–1.7)
Albumin ELP: 4 g/dL (ref 2.9–4.4)
Alpha-1-Globulin: 0.2 g/dL (ref 0.0–0.4)
Alpha-2-Globulin: 0.6 g/dL (ref 0.4–1.0)
Beta Globulin: 0.8 g/dL (ref 0.7–1.3)
Gamma Globulin: 2.1 g/dL — ABNORMAL HIGH (ref 0.4–1.8)
Globulin, Total: 3.7 g/dL (ref 2.2–3.9)
M-Spike, %: 1.8 g/dL — ABNORMAL HIGH
Total Protein ELP: 7.7 g/dL (ref 6.0–8.5)

## 2019-08-14 ENCOUNTER — Telehealth (HOSPITAL_COMMUNITY): Payer: Self-pay

## 2019-08-14 NOTE — Telephone Encounter (Signed)
Pt insurance is active and benefits verified through [employer] Co-pay 0, DED $1,000/0 met, out of pocket $4,000/$160.42 met, co-insurance 20%. no pre-authorization required. Passport, 08/14/2019@8:41am, REF# [id number]  Will contact patient to see if he is interested in the Cardiac Rehab Program. If interested, patient will need to complete follow up appt. Once completed, patient will be contacted for scheduling upon review by the RN Navigator. 

## 2019-09-02 ENCOUNTER — Encounter (HOSPITAL_COMMUNITY): Payer: Self-pay

## 2019-09-02 NOTE — Telephone Encounter (Signed)
Pt called back stated that he is not working at the moment and that he doesn't have insurance anymore so he doesn't want to do in house cardiac rehab until he gets insurance but would like to do the virtual cardiac rehab. Pt will come in for the walk test/orientation on 09/04/2019@2 :15pm.

## 2019-09-03 ENCOUNTER — Telehealth (HOSPITAL_COMMUNITY): Payer: Self-pay

## 2019-09-03 NOTE — Telephone Encounter (Signed)
Cardiac Rehab Note:  Unsuccessful telephone encounter to Tawni Levy to confirm cardiac rehab orientation appointment 09/04/19@2 :15pm. HIPAA compliant VM message left requesting call back.  Terrace Chiem E. Rollene Rotunda RN, BSN [place]. [facility]  Cardiac and Pulmonary Rehabilitation Phone: [phone] Fax: [phone]

## 2019-09-04 ENCOUNTER — Encounter (HOSPITAL_COMMUNITY)
Admission: RE | Admit: 2019-09-04 | Discharge: 2019-09-04 | Disposition: A | Discharge status: Home / Self Care | Payer: Self-pay | Source: Ambulatory Visit | Attending: Cardiology | Admitting: Cardiology

## 2019-09-04 ENCOUNTER — Other Ambulatory Visit: Payer: Self-pay

## 2019-09-04 VITALS — Ht 66.5 in | Wt 190.3 lb

## 2019-09-04 DIAGNOSIS — Z951 Presence of aortocoronary bypass graft: Secondary | ICD-10-CM

## 2019-09-04 NOTE — Progress Notes (Signed)
Cardiac Rehab Medication Review by a RN  Does the patient feel that his/her medications are working for him/her?  yes  Has the patient been experiencing any side effects to the medications prescribed?  no  Does the patient measure his/her own blood pressure or blood glucose at home?  yes   Does the patient have any problems obtaining medications due to transportation or finances?   no  Understanding of regimen: good Understanding of indications: good Potential of compliance: good    RN comments: Reviewed medication list in Epic.  Pt believes that he is taking another medication.  Reviewed notes in Care Everywhere.  Per last OV note, medications match in Epic.  Pt was asked to call CR when he returns home IF there is an additional medication.    Walter Perez 09/04/2019 3:21 PM

## 2019-09-04 NOTE — Progress Notes (Signed)
Cardiac Individual Treatment Plan  Patient Details  Name: KINNETH SHALER MRN: [id number] Date of Birth: 1956/04/30 Referring Provider:     [facility] from 09/04/2019 in [place] [facility]  Referring Provider  Fransico Him MD      Initial Encounter Date:    [facility] from 09/04/2019 in [facility]  Date  09/04/19      Visit Diagnosis: S/P CABG x 4  Patient's Home Medications on Admission:  Current Outpatient Medications:  .  aspirin 81 MG EC tablet, Take by mouth., Disp: , Rfl:  .  atorvastatin (LIPITOR) 80 MG tablet, Take 80 mg by mouth daily at 6 PM. , Disp: , Rfl:  .  clopidogrel (PLAVIX) 75 MG tablet, Take 75 mg by mouth daily. , Disp: , Rfl:  .  metoprolol tartrate (LOPRESSOR) 25 MG tablet, Take by mouth., Disp: , Rfl:  .  amiodarone (PACERONE) 200 MG tablet, Take 200 mg by mouth daily., Disp: , Rfl:  .  potassium chloride SA (KLOR-CON) 20 MEQ tablet, Take 20 mEq by mouth daily., Disp: , Rfl:   Past Medical History: Past Medical History:  Diagnosis Date  . Hyperlipidemia   . Hypertension     Tobacco Use: Social History   Tobacco Use  Smoking Status Never Smoker    Labs: Recent Review Flowsheet Data    There is no flowsheet data to display.      Capillary Blood Glucose: No results found for: GLUCAP   Exercise Target Goals: Exercise Program Goal: Individual exercise prescription set using results from initial 6 min walk test and THRR while considering  patient's activity barriers and safety.   Exercise Prescription Goal: Initial exercise prescription builds to 30-45 minutes a day of aerobic activity, 2-3 days per week.  Home exercise guidelines will be given to patient during program as part of exercise prescription that the participant will acknowledge.  Activity Barriers & Risk Stratification: Activity Barriers & Cardiac Risk Stratification - 09/04/19 1517       Activity Barriers & Cardiac Risk Stratification   Activity Barriers  None    Cardiac Risk Stratification  High       6 Minute Walk: 6 Minute Walk    Row Name 09/04/19 1502         6 Minute Walk   Phase  Initial     Distance  1420 feet     Walk Time  6 minutes     # of Rest Breaks  0     MPH  2.7     METS  3.41     RPE  11     Perceived Dyspnea   0     VO2 Peak  12     Symptoms  No     Resting HR  70 bpm     Resting BP  124/70     Resting Oxygen Saturation   98 %     Exercise Oxygen Saturation  during 6 min walk  96 %     Max Ex. HR  97 bpm     Max Ex. BP  132/80     2 Minute Post BP  120/70        Oxygen Initial Assessment:   Oxygen Re-Evaluation:   Oxygen Discharge (Final Oxygen Re-Evaluation):   Initial Exercise Prescription: Initial Exercise Prescription - 09/04/19 1500      Date of Initial Exercise RX and  Referring Provider   Date  09/04/19    Referring Provider  Fransico Him MD      Track   Minutes  30      Prescription Details   Frequency (times per week)  3-4    Duration  Progress to 45 minutes of aerobic exercise without signs/symptoms of physical distress      Intensity   THRR 40-80% of Max Heartrate  64-127    Ratings of Perceived Exertion  11-13    Perceived Dyspnea  0-4      Progression   Progression  Continue progressive overload as per policy without signs/symptoms or physical distress.       Perform Capillary Blood Glucose checks as needed.  Exercise Prescription Changes:   Exercise Comments:   Exercise Goals and Review: Exercise Goals    Row Name 09/04/19 1504             Exercise Goals   Increase Physical Activity  Yes       Intervention  Provide advice, education, support and counseling about physical activity/exercise needs.;Develop an individualized exercise prescription for aerobic and resistive training based on initial evaluation findings, risk stratification, comorbidities and participant's personal  goals.       Expected Outcomes  Short Term: Attend rehab on a regular basis to increase amount of physical activity.;Long Term: Add in home exercise to make exercise part of routine and to increase amount of physical activity.;Long Term: Exercising regularly at least 3-5 days a week.       Increase Strength and Stamina  Yes       Intervention  Provide advice, education, support and counseling about physical activity/exercise needs.;Develop an individualized exercise prescription for aerobic and resistive training based on initial evaluation findings, risk stratification, comorbidities and participant's personal goals.       Expected Outcomes  Short Term: Increase workloads from initial exercise prescription for resistance, speed, and METs.;Short Term: Perform resistance training exercises routinely during rehab and add in resistance training at home;Long Term: Improve cardiorespiratory fitness, muscular endurance and strength as measured by increased METs and functional capacity (6MWT)       Able to understand and use rate of perceived exertion (RPE) scale  Yes       Intervention  Provide education and explanation on how to use RPE scale       Expected Outcomes  Short Term: Able to use RPE daily in rehab to express subjective intensity level;Long Term:  Able to use RPE to guide intensity level when exercising independently       Knowledge and understanding of Target Heart Rate Range (THRR)  Yes       Intervention  Provide education and explanation of THRR including how the numbers were predicted and where they are located for reference       Expected Outcomes  Short Term: Able to state/look up THRR;Long Term: Able to use THRR to govern intensity when exercising independently;Short Term: Able to use daily as guideline for intensity in rehab       Able to check pulse independently  Yes       Intervention  Provide education and demonstration on how to check pulse in carotid and radial arteries.;Review the  importance of being able to check your own pulse for safety during independent exercise       Expected Outcomes  Short Term: Able to explain why pulse checking is important during independent exercise;Long Term: Able to check pulse independently and accurately  Understanding of Exercise Prescription  Yes       Intervention  Provide education, explanation, and written materials on patient's individual exercise prescription       Expected Outcomes  Short Term: Able to explain program exercise prescription;Long Term: Able to explain home exercise prescription to exercise independently          Exercise Goals Re-Evaluation :   Discharge Exercise Prescription (Final Exercise Prescription Changes):   Nutrition:  Target Goals: Understanding of nutrition guidelines, daily intake of sodium 1500mg , cholesterol 200mg , calories 30% from fat and 7% or less from saturated fats, daily to have 5 or more servings of fruits and vegetables.  Biometrics: Pre Biometrics - 09/04/19 1503      Pre Biometrics   Height  5' 6.5" (1.689 m)    Weight  86.3 kg    Waist Circumference  41 inches    Hip Circumference  39 inches    Waist to Hip Ratio  1.05 %    BMI (Calculated)  30.25    Triceps Skinfold  12 mm    % Body Fat  28 %    Grip Strength  54 kg    Flexibility  14 in    Single Leg Stand  20 seconds        Nutrition Therapy Plan and Nutrition Goals:   Nutrition Assessments:   Nutrition Goals Re-Evaluation:   Nutrition Goals Re-Evaluation:   Nutrition Goals Discharge (Final Nutrition Goals Re-Evaluation):   Psychosocial: Target Goals: Acknowledge presence or absence of significant depression and/or stress, maximize coping skills, provide positive support system. Participant is able to verbalize types and ability to use techniques and skills needed for reducing stress and depression.  Initial Review & Psychosocial Screening: Initial Psych Review & Screening - 09/04/19 1405       Initial Review   Current issues with  None Identified      Family Dynamics   Good Support System?  Yes   Pt states his wife is a source of support.     Barriers   Psychosocial barriers to participate in program  There are no identifiable barriers or psychosocial needs.      Screening Interventions   Interventions  Encouraged to exercise       Quality of Life Scores: Quality of Life - 09/04/19 1433      Quality of Life   Select  Quality of Life      Quality of Life Scores   Health/Function Pre  25.57 %    Socioeconomic Pre  23 %    Psych/Spiritual Pre  22.25 %    Family Pre  26 %    GLOBAL Pre  24.48 %      Scores of 19 and below usually indicate a poorer quality of life in these areas.  A difference of  2-3 points is a clinically meaningful difference.  A difference of 2-3 points in the total score of the Quality of Life Index has been associated with significant improvement in overall quality of life, self-image, physical symptoms, and general health in studies assessing change in quality of life.  PHQ-9: Recent Review Flowsheet Data    Depression screen [facility] 2/9 09/04/2019   Decreased Interest 0   Down, Depressed, Hopeless 0   PHQ - 2 Score 0     Interpretation of Total Score  Total Score Depression Severity:  1-4 = Minimal depression, 5-9 = Mild depression, 10-14 = Moderate depression, 15-19 = Moderately severe depression, 20-27 =  Severe depression   Psychosocial Evaluation and Intervention:   Psychosocial Re-Evaluation:   Psychosocial Discharge (Final Psychosocial Re-Evaluation):   Vocational Rehabilitation: Provide vocational rehab assistance to qualifying candidates.   Vocational Rehab Evaluation & Intervention: Vocational Rehab - 09/04/19 1516      Initial Vocational Rehab Evaluation & Intervention   Assessment shows need for Vocational Rehabilitation  No   Pt is currently out of work d/t his recovery from surgery.      Education: Education Goals:  Education classes will be provided on a weekly basis, covering required topics. Participant will state understanding/return demonstration of topics presented.  Learning Barriers/Preferences:   Education Topics: Count Your Pulse:  -Group instruction provided by verbal instruction, demonstration, patient participation and written materials to support subject.  Instructors address importance of being able to find your pulse and how to count your pulse when at home without a heart monitor.  Patients get hands on experience counting their pulse with staff help and individually.   Heart Attack, Angina, and Risk Factor Modification:  -Group instruction provided by verbal instruction, video, and written materials to support subject.  Instructors address signs and symptoms of angina and heart attacks.    Also discuss risk factors for heart disease and how to make changes to improve heart health risk factors.   Functional Fitness:  -Group instruction provided by verbal instruction, demonstration, patient participation, and written materials to support subject.  Instructors address safety measures for doing things around the house.  Discuss how to get up and down off the floor, how to pick things up properly, how to safely get out of a chair without assistance, and balance training.   Meditation and Mindfulness:  -Group instruction provided by verbal instruction, patient participation, and written materials to support subject.  Instructor addresses importance of mindfulness and meditation practice to help reduce stress and improve awareness.  Instructor also leads participants through a meditation exercise.    Stretching for Flexibility and Mobility:  -Group instruction provided by verbal instruction, patient participation, and written materials to support subject.  Instructors lead participants through series of stretches that are designed to increase flexibility thus improving mobility.  These  stretches are additional exercise for major muscle groups that are typically performed during regular warm up and cool down.   Hands Only CPR:  -Group verbal, video, and participation provides a basic overview of AHA guidelines for community CPR. Role-play of emergencies allow participants the opportunity to practice calling for help and chest compression technique with discussion of AED use.   Hypertension: -Group verbal and written instruction that provides a basic overview of hypertension including the most recent diagnostic guidelines, risk factor reduction with self-care instructions and medication management.    Nutrition I class: Heart Healthy Eating:  -Group instruction provided by PowerPoint slides, verbal discussion, and written materials to support subject matter. The instructor gives an explanation and review of the Therapeutic Lifestyle Changes diet recommendations, which includes a discussion on lipid goals, dietary fat, sodium, fiber, plant stanol/sterol esters, sugar, and the components of a well-balanced, healthy diet.   Nutrition II class: Lifestyle Skills:  -Group instruction provided by PowerPoint slides, verbal discussion, and written materials to support subject matter. The instructor gives an explanation and review of label reading, grocery shopping for heart health, heart healthy recipe modifications, and ways to make healthier choices when eating out.   Diabetes Question & Answer:  -Group instruction provided by PowerPoint slides, verbal discussion, and written materials to support subject matter. The  instructor gives an explanation and review of diabetes co-morbidities, pre- and post-prandial blood glucose goals, pre-exercise blood glucose goals, signs, symptoms, and treatment of hypoglycemia and hyperglycemia, and foot care basics.   Diabetes Blitz:  -Group instruction provided by PowerPoint slides, verbal discussion, and written materials to support subject  matter. The instructor gives an explanation and review of the physiology behind type 1 and type 2 diabetes, diabetes medications and rational behind using different medications, pre- and post-prandial blood glucose recommendations and Hemoglobin A1c goals, diabetes diet, and exercise including blood glucose guidelines for exercising safely.    Portion Distortion:  -Group instruction provided by PowerPoint slides, verbal discussion, written materials, and food models to support subject matter. The instructor gives an explanation of serving size versus portion size, changes in portions sizes over the last 20 years, and what consists of a serving from each food group.   Stress Management:  -Group instruction provided by verbal instruction, video, and written materials to support subject matter.  Instructors review role of stress in heart disease and how to cope with stress positively.     Exercising on Your Own:  -Group instruction provided by verbal instruction, power point, and written materials to support subject.  Instructors discuss benefits of exercise, components of exercise, frequency and intensity of exercise, and end points for exercise.  Also discuss use of nitroglycerin and activating EMS.  Review options of places to exercise outside of rehab.  Review guidelines for sex with heart disease.   Cardiac Drugs I:  -Group instruction provided by verbal instruction and written materials to support subject.  Instructor reviews cardiac drug classes: antiplatelets, anticoagulants, beta blockers, and statins.  Instructor discusses reasons, side effects, and lifestyle considerations for each drug class.   Cardiac Drugs II:  -Group instruction provided by verbal instruction and written materials to support subject.  Instructor reviews cardiac drug classes: angiotensin converting enzyme inhibitors (ACE-I), angiotensin II receptor blockers (ARBs), nitrates, and calcium channel blockers.  Instructor  discusses reasons, side effects, and lifestyle considerations for each drug class.   Anatomy and Physiology of the Circulatory System:  Group verbal and written instruction and models provide basic cardiac anatomy and physiology, with the coronary electrical and arterial systems. Review of: AMI, Angina, Valve disease, Heart Failure, Peripheral Artery Disease, Cardiac Arrhythmia, Pacemakers, and the ICD.   Other Education:  -Group or individual verbal, written, or video instructions that support the educational goals of the cardiac rehab program.   Holiday Eating Survival Tips:  -Group instruction provided by PowerPoint slides, verbal discussion, and written materials to support subject matter. The instructor gives patients tips, tricks, and techniques to help them not only survive but enjoy the holidays despite the onslaught of food that accompanies the holidays.   Knowledge Questionnaire Score: Knowledge Questionnaire Score - 09/04/19 1434      Knowledge Questionnaire Score   Pre Score  19/24       Core Components/Risk Factors/Patient Goals at Admission: Personal Goals and Risk Factors at Admission - 09/04/19 1525      Core Components/Risk Factors/Patient Goals on Admission   Hypertension  Yes    Intervention  Provide education on lifestyle modifcations including regular physical activity/exercise, weight management, moderate sodium restriction and increased consumption of fresh fruit, vegetables, and low fat dairy, alcohol moderation, and smoking cessation.;Monitor prescription use compliance.    Expected Outcomes  Short Term: Continued assessment and intervention until BP is < 140/45mm HG in hypertensive participants. < 130/36mm HG in hypertensive participants with diabetes,  heart failure or chronic kidney disease.;Long Term: Maintenance of blood pressure at goal levels.    Lipids  Yes    Intervention  Provide education and support for participant on nutrition & aerobic/resistive  exercise along with prescribed medications to achieve LDL 70mg , HDL >40mg .    Expected Outcomes  Short Term: Participant states understanding of desired cholesterol values and is compliant with medications prescribed. Participant is following exercise prescription and nutrition guidelines.;Long Term: Cholesterol controlled with medications as prescribed, with individualized exercise RX and with personalized nutrition plan. Value goals: LDL < 70mg , HDL > 40 mg.       Core Components/Risk Factors/Patient Goals Review:    Core Components/Risk Factors/Patient Goals at Discharge (Final Review):    ITP Comments: ITP Comments    Row Name 09/04/19 1626           ITP Comments  Dr. Fransico Him, Medical Director          Comments: Patient attended orientation on 09/04/2019 to review rules and guidelines for program.  Completed 6 minute walk test, Intitial ITP, and exercise prescription.  VSS. Telemetry-SR.  Asymptomatic. Safety measures and social distancing in place per CDC guidelines.

## 2019-09-04 NOTE — Progress Notes (Signed)
         Confirm Consent - In the setting of the current Covid19 crisis, you are scheduled for a phone visit with your Cardiac or Pulmonary team member.  Just as we do with many in-gym visits, in order for you to participate in this visit, we must obtain consent.  If you'd like, I can send this to your mychart (if signed up) or email for you to review.  Otherwise, I can obtain your verbal consent now.  By agreeing to a telephone visit, we'd like you to understand that the technology does not allow for your Cardiac or Pulmonary Rehab team member to perform a physical assessment, and thus may limit their ability to fully assess your ability to perform exercise programs. If your provider identifies any concerns that need to be evaluated in person, we will make arrangements to do so.  Finally, though the technology is pretty good, we cannot assure that it will always work on either your or our end and we cannot ensure that we have a secure connection.  Cardiac and Pulmonary Rehab Telehealth visits and "At Home" cardiac and pulmonary rehab are provided at no cost to you.        Are you willing to proceed?"        STAFF: Did the patient verbally acknowledge consent to telehealth visit? Document YES/NO here: Yes     Noel Christmas  Cardiac and Pulmonary Rehab Staff       Date 09/04/2019     Time 1500        Spoke to pt regarding Virtual Cardiac  and Pulmonary Rehab.  Pt  was able to download the [employer] app on their smart device with no issues. Pt set up their account and received the following welcome message -"Welcome to the [facility] and Pulmonary Rehabilitation program. We hope that you will find the exercise program beneficial in your recovery process. Our staff is available to assist with any questions/concerns about your exercise routine. Best wishes". Brief orientation provided to with the advisement to watch the "Intro to Rehab" series located under the Resource tab. Pt verbalized  understanding. Will continue to follow and monitor pt progress with feedback as needed.

## 2019-09-11 ENCOUNTER — Encounter (HOSPITAL_COMMUNITY)
Admission: RE | Admit: 2019-09-11 | Discharge: 2019-09-11 | Disposition: A | Discharge status: Home / Self Care | Payer: Self-pay | Source: Ambulatory Visit | Attending: Cardiology | Admitting: Cardiology

## 2019-09-11 ENCOUNTER — Other Ambulatory Visit: Payer: Self-pay

## 2019-09-11 NOTE — Progress Notes (Signed)
Virtual Cardiac Rehab Note  Spoke with pt over the phone. Pt declined nutrition assessment and services for his time in cardiac rehab. He states he has tried to cut back on fried food. Offered assistance via the [employer] app and told him RD was available if he changes his mind.  Will continue to monitor on [employer] app.   Michaele Offer, MS, RDN, LDN

## 2019-09-12 ENCOUNTER — Encounter (HOSPITAL_COMMUNITY)
Admission: RE | Admit: 2019-09-12 | Discharge: 2019-09-12 | Disposition: A | Discharge status: Home / Self Care | Payer: 59 | Source: Ambulatory Visit | Attending: Cardiology | Admitting: Cardiology

## 2019-09-12 NOTE — Progress Notes (Signed)
Cardiac Rehab: Virtual Visit  Patient participates in the virtual cardiac rehab program via the [employer] app. Spoke with patient today to check progress with exercise. Patient states that he's walking ~ 2 miles daily but only will log exercise every other day. Patient is checking his heart rate and blood pressure before and after exercise. Discussed target heart rate range and temperature precautions when walking outside. I will mail patient home exercise guidelines and stretches. Will continue to follow.  Sol Passer, MS, ACSM CEP

## 2019-09-17 ENCOUNTER — Telehealth (HOSPITAL_COMMUNITY): Payer: Self-pay

## 2019-09-17 ENCOUNTER — Encounter (HOSPITAL_COMMUNITY)
Admission: RE | Admit: 2019-09-17 | Discharge: 2019-09-17 | Disposition: A | Discharge status: Home / Self Care | Payer: 59 | Source: Ambulatory Visit | Attending: Cardiology | Admitting: Cardiology

## 2019-09-17 ENCOUNTER — Other Ambulatory Visit: Payer: Self-pay

## 2019-09-17 NOTE — Telephone Encounter (Signed)
Virtual Cardiac Rehab Note:  Unsuccessful telephone encounter to Niran Coney to follow up on barriers to utilizing the [employer] Virtual Cardiac Rehab App. [employer] compliant VM message left requesting call back to [phone].  Severn Goddard E. Rollene Rotunda RN, BSN [place]. [facility]  Cardiac and Pulmonary Rehabilitation Phone: [phone] Fax: [phone]

## 2019-09-17 NOTE — Progress Notes (Signed)
Virtual Cardiac Rehab Note:  Successful telephone encounter with Sim Trundle to follow up on lack of utilization of the [employer] Virtual Cardiac Rehab App. Mr. Laughlin states he does not understand how to log his daily exercise. Indepth tutorial provided utilizing patients phone. Patient able to verbalize information on screen as CR RN verbally walks patient through each step and page of the app. Patient states he will begin to utilize app later today. He is also provided tutorial on how to access educational material via app. Patient appreciative of assistance.   Plan: Will continue to monitor app documentation and provide assistance and interventions as needed.  Portia E. Rollene Rotunda RN, BSN [place]. [facility]  Cardiac and Pulmonary Rehabilitation Phone: [phone] Fax: [phone]

## 2019-10-13 ENCOUNTER — Inpatient Hospital Stay (HOSPITAL_COMMUNITY): Payer: 59

## 2019-10-20 ENCOUNTER — Ambulatory Visit (HOSPITAL_COMMUNITY): Payer: Managed Care, Other (non HMO) | Admitting: Hematology

## 2019-11-19 ENCOUNTER — Telehealth (HOSPITAL_COMMUNITY): Payer: Self-pay | Admitting: *Deleted

## 2019-12-02 ENCOUNTER — Encounter (HOSPITAL_COMMUNITY)
Admission: RE | Admit: 2019-12-02 | Discharge: 2019-12-02 | Disposition: A | Discharge status: Home / Self Care | Payer: 59 | Source: Ambulatory Visit | Attending: Cardiology | Admitting: Cardiology

## 2019-12-02 DIAGNOSIS — Z951 Presence of aortocoronary bypass graft: Secondary | ICD-10-CM | POA: Insufficient documentation

## 2019-12-02 NOTE — Progress Notes (Signed)
Cardiac Rehab: Virtual Visit  Patient participates in the virtual cardiac rehab program via the [employer] app. Spoke with patient today to follow-up on exercise and schedule post walk test. Patient states that he has been walking 45-60 minutes daily without symptoms and is doing well with his exercise. Patient states that his blood pressure machine broke, which is why he hasn't been logging, but he saw his PCP recently and his vitals were good then. 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 I encouraged patient to let his physician know about his dizziness, but he states it's occurred very rarely, and resolved quickly.  I discussed with patient scheduling a time to come in for his post-walk test and measurements since his virtual program is complete. Patient is currently in [place] and requested call back on Friday 12/05/19. I informed patient that I will be off on Friday, and if the nurse case manager can't call him to schedule follow-up walk test, then I will call him back on Monday. Patient is amenable to this. Sol Passer, MS, ACSM CEP

## 2019-12-08 ENCOUNTER — Other Ambulatory Visit: Payer: Self-pay

## 2019-12-08 ENCOUNTER — Inpatient Hospital Stay (HOSPITAL_COMMUNITY): Payer: 59 | Attending: Hematology

## 2019-12-08 DIAGNOSIS — I10 Essential (primary) hypertension: Secondary | ICD-10-CM | POA: Insufficient documentation

## 2019-12-08 DIAGNOSIS — C9000 Multiple myeloma not having achieved remission: Secondary | ICD-10-CM | POA: Diagnosis not present

## 2019-12-08 DIAGNOSIS — I252 Old myocardial infarction: Secondary | ICD-10-CM | POA: Insufficient documentation

## 2019-12-08 DIAGNOSIS — I2510 Atherosclerotic heart disease of native coronary artery without angina pectoris: Secondary | ICD-10-CM | POA: Insufficient documentation

## 2019-12-08 DIAGNOSIS — Z79899 Other long term (current) drug therapy: Secondary | ICD-10-CM | POA: Insufficient documentation

## 2019-12-08 DIAGNOSIS — Z7982 Long term (current) use of aspirin: Secondary | ICD-10-CM | POA: Insufficient documentation

## 2019-12-08 DIAGNOSIS — E785 Hyperlipidemia, unspecified: Secondary | ICD-10-CM | POA: Insufficient documentation

## 2019-12-08 DIAGNOSIS — D472 Monoclonal gammopathy: Secondary | ICD-10-CM

## 2019-12-08 LAB — CBC WITH DIFFERENTIAL/PLATELET
Abs Immature Granulocytes: 0.03 10*3/uL (ref 0.00–0.07)
Basophils Absolute: 0 10*3/uL (ref 0.0–0.1)
Basophils Relative: 1 %
Eosinophils Absolute: 0.4 10*3/uL (ref 0.0–0.5)
Eosinophils Relative: 6 %
HCT: 41 % (ref 39.0–52.0)
Hemoglobin: 13.7 g/dL (ref 13.0–17.0)
Immature Granulocytes: 1 %
Lymphocytes Relative: 20 %
Lymphs Abs: 1.2 10*3/uL (ref 0.7–4.0)
MCH: 29.2 pg (ref 26.0–34.0)
MCHC: 33.4 g/dL (ref 30.0–36.0)
MCV: 87.4 fL (ref 80.0–100.0)
Monocytes Absolute: 0.5 10*3/uL (ref 0.1–1.0)
Monocytes Relative: 8 %
Neutro Abs: 4.1 10*3/uL (ref 1.7–7.7)
Neutrophils Relative %: 64 %
Platelets: 183 10*3/uL (ref 150–400)
RBC: 4.69 MIL/uL (ref 4.22–5.81)
RDW: 13.2 % (ref 11.5–15.5)
WBC: 6.1 10*3/uL (ref 4.0–10.5)
nRBC: 0 % (ref 0.0–0.2)

## 2019-12-08 LAB — COMPREHENSIVE METABOLIC PANEL
ALT: 26 U/L (ref 0–44)
AST: 26 U/L (ref 15–41)
Albumin: 4.3 g/dL (ref 3.5–5.0)
Alkaline Phosphatase: 63 U/L (ref 38–126)
Anion gap: 11 (ref 5–15)
BUN: 19 mg/dL (ref 8–23)
CO2: 27 mmol/L (ref 22–32)
Calcium: 8.8 mg/dL — ABNORMAL LOW (ref 8.9–10.3)
Chloride: 97 mmol/L — ABNORMAL LOW (ref 98–111)
Creatinine, Ser: 1.15 mg/dL (ref 0.61–1.24)
GFR calc Af Amer: >60 mL/min (ref >60)
GFR calc non Af Amer: >60 mL/min (ref >60)
Glucose, Bld: 111 mg/dL — ABNORMAL HIGH (ref 70–99)
Potassium: 3.3 mmol/L — ABNORMAL LOW (ref 3.5–5.1)
Sodium: 135 mmol/L (ref 135–145)
Total Bilirubin: 1.4 mg/dL — ABNORMAL HIGH (ref 0.3–1.2)
Total Protein: 8.4 g/dL — ABNORMAL HIGH (ref 6.5–8.1)

## 2019-12-08 LAB — LACTATE DEHYDROGENASE: LDH: 188 U/L (ref 98–192)

## 2019-12-09 ENCOUNTER — Telehealth (HOSPITAL_COMMUNITY): Payer: Self-pay

## 2019-12-09 LAB — KAPPA/LAMBDA LIGHT CHAINS
Kappa free light chain: 9.6 mg/L (ref 3.3–19.4)
Kappa, lambda light chain ratio: 0.04 — ABNORMAL LOW (ref 0.26–1.65)
Lambda free light chains: 259.8 mg/L — ABNORMAL HIGH (ref 5.7–26.3)

## 2019-12-09 NOTE — Telephone Encounter (Signed)
Virtual Cardiac Rehab Note:  Successful telephone encounter to Walter Perez to schedule post VCR program completion 6 min walk test and post assessment. Walter Perez states he continues to exercise daily. He is aggreable to an in-person follow up appointment 12/23/19 at 0730. He is instructed to complete mailed "homework" and return at appointment.   Renn Stille E. Rollene Rotunda RN, BSN [place]. [facility]  Cardiac and Pulmonary Rehabilitation Phone: [phone] Fax: [phone]

## 2019-12-10 LAB — PROTEIN ELECTROPHORESIS, SERUM
A/G Ratio: 1.1 (ref 0.7–1.7)
Albumin ELP: 4.2 g/dL (ref 2.9–4.4)
Alpha-1-Globulin: 0.2 g/dL (ref 0.0–0.4)
Alpha-2-Globulin: 0.6 g/dL (ref 0.4–1.0)
Beta Globulin: 0.8 g/dL (ref 0.7–1.3)
Gamma Globulin: 2.1 g/dL — ABNORMAL HIGH (ref 0.4–1.8)
Globulin, Total: 3.7 g/dL (ref 2.2–3.9)
M-Spike, %: 2 g/dL — ABNORMAL HIGH
Total Protein ELP: 7.9 g/dL (ref 6.0–8.5)

## 2019-12-16 ENCOUNTER — Encounter (HOSPITAL_COMMUNITY): Payer: Self-pay | Admitting: Hematology

## 2019-12-16 ENCOUNTER — Other Ambulatory Visit: Payer: Self-pay

## 2019-12-16 ENCOUNTER — Inpatient Hospital Stay (HOSPITAL_COMMUNITY): Payer: 59 | Admitting: Hematology

## 2019-12-16 VITALS — BP 150/96 | HR 63 | Temp 97.1°F | Resp 18 | Wt 191.6 lb

## 2019-12-16 DIAGNOSIS — C9000 Multiple myeloma not having achieved remission: Secondary | ICD-10-CM | POA: Diagnosis not present

## 2019-12-16 DIAGNOSIS — D472 Monoclonal gammopathy: Secondary | ICD-10-CM

## 2019-12-16 MED ORDER — CEPHALEXIN 500 MG PO CAPS: 500.0000 mg | ORAL_CAPSULE | Freq: Two times a day (BID) | ORAL | 0 refills | Status: DC

## 2019-12-16 NOTE — Patient Instructions (Signed)
[place] at [facility] Discharge Instructions  You were seen today by Dr. Delton Coombes. He went over your recent results. You will be given antibiotics. Dr. Delton Coombes will see you back in 4 months for labs and follow up.   Thank you for choosing [place] at [address] to provide your oncology and hematology care.  To afford each patient quality time with our provider, please arrive at least 15 minutes before your scheduled appointment time.   If you have a lab appointment with the [facility] [place] please come in thru the Main Entrance and check in at the main information desk  You need to re-schedule your appointment should you arrive 10 or more minutes late.  We strive to give you quality time with our providers, and arriving late affects you and other patients whose appointments are after yours.  Also, if you no show three or more times for appointments you may be dismissed from the clinic at the providers discretion.     Again, thank you for choosing [facility].  Our hope is that these requests will decrease the amount of time that you wait before being seen by our physicians.       _____________________________________________________________  Should you have questions after your visit to [facility], please contact our office at [phone] between the hours of 8:00 a.m. and 4:30 p.m.  Voicemails left after 4:00 p.m. will not be returned until the following business day.  For prescription refill requests, have your pharmacy contact our office and allow 72 hours.    Cancer Center Support Programs:   > Cancer Support Group  2nd Tuesday of the month 1pm-2pm, Journey Room

## 2019-12-16 NOTE — Progress Notes (Signed)
[address], [place] [address]   CLINIC:  Medical Oncology/Hematology  PCP:  Patient, No Pcp Per None  None  REASON FOR VISIT:  Follow-up for smoldering multiple myeloma.  CURRENT THERAPY: Observation.  INTERVAL HISTORY:  Mr. Walter Perez, a 64 y.o. 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 Walter Perez was last seen on 07/14/2019.  Today he reports no new pains or issues. He denies numbness or tingling in hands or feet. He denies F/C or recent infections.   He had quadruple bypass surgery on 06/19/2019 at [facility] and he is currently during cardiac rehab. He is retired now.   REVIEW OF SYSTEMS:  Review of Systems  Constitutional: Negative for appetite change, chills, fatigue and fever.  Cardiovascular: Negative for leg swelling.  Gastrointestinal: Negative for nausea and vomiting.  Neurological: Negative for numbness.  All other systems reviewed and are negative.   PAST MEDICAL/SURGICAL HISTORY:  Past Medical History:  Diagnosis Date   Hyperlipidemia    Hypertension    Myocardial infarction ([place]) 06/19/2019   History reviewed. No pertinent surgical history.  SOCIAL HISTORY:  Social History   Socioeconomic History   Marital status: Married    Spouse name: Not on file   Number of children: 2   Years of education: Not on file   Highest education level: Not on file  Occupational History   Occupation: Unemployed  Tobacco Use   Smoking status: Never Smoker  Substance and Sexual Activity   Alcohol use: Never   Drug use: Never   Sexual activity: Yes  Other Topics Concern   Not on file  Social History Narrative   Not on file   Social Determinants of Health   Financial Resource Strain:    Difficulty of Paying Living Expenses:   Food Insecurity:    Worried About Charity fundraiser in the Last Year:    Arboriculturist in the Last Year:   Transportation Needs:    Consulting civil engineer (Medical):    Lack of Transportation (Non-Medical):   Physical Activity:    Days of Exercise per Week:    Minutes of Exercise per Session:   Stress:    Feeling of Stress :   Social Connections:    Frequency of Communication with Friends and Family:    Frequency of Social Gatherings with Friends and Family:    Attends Religious Services:    Active Member of Clubs or Organizations:    Attends Music therapist:    Marital Status:   Intimate Partner Violence:    Fear of Current or Ex-Partner:    Emotionally Abused:    Physically Abused:    Sexually Abused:     FAMILY HISTORY:  Family History  Problem Relation Age of Onset   Stroke Mother    Stroke Father     CURRENT MEDICATIONS:  Current Outpatient Medications  Medication Sig Dispense Refill   aspirin 81 MG EC tablet Take by mouth.     atorvastatin (LIPITOR) 80 MG tablet Take 80 mg by mouth daily at 6 PM.      clopidogrel (PLAVIX) 75 MG tablet Take 75 mg by mouth daily.      metoprolol tartrate (LOPRESSOR) 25 MG tablet Take by mouth.     olmesartan-hydrochlorothiazide (BENICAR HCT) 40-25 MG tablet Take 1 tablet by mouth daily.     cephALEXin (KEFLEX) 500 MG capsule Take 1 capsule (500 mg total)  by mouth 2 (two) times daily. 10 capsule 0   nitroGLYCERIN (NITROSTAT) 0.4 MG SL tablet  (Patient not taking: Reported on 12/16/2019)     No current facility-administered medications for this visit.    ALLERGIES:  No Known Allergies  PHYSICAL EXAM:  Performance status (ECOG): 0 - Asymptomatic  Vitals:   12/16/19 1155  BP: (!) 150/96  Pulse: 63  Resp: 18  Temp: (!) 97.1 F (36.2 C)  SpO2: 98%   Wt Readings from Last 3 Encounters:  12/16/19 191 lb 9.6 oz (86.9 kg)  09/04/19 190 lb 4.1 oz (86.3 kg)  07/14/19 185 lb 4.8 oz (84.1 kg)   Physical Exam Vitals reviewed.  Constitutional:      Appearance: Normal appearance. He is obese.  Cardiovascular:     Rate and  Rhythm: Normal rate and regular rhythm.     Pulses: Normal pulses.     Heart sounds: Normal heart sounds.  Pulmonary:     Effort: Pulmonary effort is normal.     Breath sounds: Normal breath sounds.  Abdominal:     Palpations: Abdomen is soft. There is no mass.     Tenderness: There is no abdominal tenderness.     Hernia: No hernia is present.  Musculoskeletal:     Cervical back: No bony tenderness.     Thoracic back: No bony tenderness.     Lumbar back: No bony tenderness.     Right lower leg: No edema.     Left lower leg: No edema.  Neurological:     General: No focal deficit present.     Mental Status: He is alert and oriented to person, place, and time.  Psychiatric:        Mood and Affect: Mood normal.        Behavior: Behavior normal.     LABORATORY DATA:  I have reviewed the labs as listed.  CBC Latest Ref Rng & Units 12/08/2019 07/07/2019 04/30/2019  WBC 4.0 - 10.5 K/uL 6.1 7.8 6.3  Hemoglobin 13.0 - 17.0 g/dL 13.7 12.0(L) 14.9  Hematocrit 39 - 52 % 41.0 38.6(L) 46.5  Platelets 150 - 400 K/uL 183 313 222   CMP Latest Ref Rng & Units 12/08/2019 07/07/2019  Glucose 70 - 99 mg/dL 111(H) 99  BUN 8 - 23 mg/dL 19 14  Creatinine 0.61 - 1.24 mg/dL 1.15 1.08  Sodium 135 - 145 mmol/L 135 136  Potassium 3.5 - 5.1 mmol/L 3.3(L) 3.9  Chloride 98 - 111 mmol/L 97(L) 101  CO2 22 - 32 mmol/L 27 26  Calcium 8.9 - 10.3 mg/dL 8.8(L) 8.6(L)  Total Protein 6.5 - 8.1 g/dL 8.4(H) 8.0  Total Bilirubin 0.3 - 1.2 mg/dL 1.4(H) 0.8  Alkaline Phos 38 - 126 U/L 63 78  AST 15 - 41 U/L 26 15  ALT 0 - 44 U/L 26 29      Component Value Date/Time   RBC 4.69 12/08/2019 1408   MCV 87.4 12/08/2019 1408   MCH 29.2 12/08/2019 1408   MCHC 33.4 12/08/2019 1408   RDW 13.2 12/08/2019 1408   LYMPHSABS 1.2 12/08/2019 1408   MONOABS 0.5 12/08/2019 1408   EOSABS 0.4 12/08/2019 1408   BASOSABS 0.0 12/08/2019 1408   Lab Results  Component Value Date   TOTALPROTELP 7.9 12/08/2019   ALBUMINELP 4.2  12/08/2019   A1GS 0.2 12/08/2019   A2GS 0.6 12/08/2019   BETS 0.8 12/08/2019   GAMS 2.1 (H) 12/08/2019   MSPIKE 2.0 (H) 12/08/2019   SPEI  Comment 12/08/2019    Lab Results  Component Value Date   KPAFRELGTCHN 9.6 12/08/2019   LAMBDASER 259.8 (H) 12/08/2019   KAPLAMBRATIO 0.04 (L) 12/08/2019    DIAGNOSTIC IMAGING:  I have independently reviewed the scans and discussed with the patient. No results found.   ASSESSMENT:  1.  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  Lambda light chains 303, ratio of 0.03.  LDH normal.  Beta-2 microglobulin 1.7.  Serum viscosity normal.  Hemoglobin, calcium and creatinine normal. -Bone marrow biopsy on 04/30/2019 showed atypical plasma cells representing 12% in the aspirate.  Plasma cells display lambda light chain restriction. -Myeloma FISH panel shows gain of 1 q.  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 -24-hour urine negative for immunofixation and UPEP.  Urine total protein was undetectable. -Skeletal survey on 05/09/2019 did not show any evidence of lytic lesions.  Insurance refused PET CT scan. -MRI of the thoracic, lumbar spine and pelvis with and without contrast on 06/18/2019 did not show any evidence of myeloma. -Based on Mayo 2018 risk stratification system, his M spike is more than 2 g/dL.  Involved/uninvolved free light chain ratio was more than 20.  Bone marrow plasma cells were less than 20%.  With 2 risk factors, he was considered high risk with estimated median time to progression of 29 months, estimated risk of progression of 24 %/year during first 2 years, 11 %/year for the next 3 years, 3 %/year for the next 5 years.  2.  CAD: -Status post CABG at [facility] in December 2020   PLAN:  1.  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  M spike is 2 g.  Creatinine is 1.15.  Calcium is 8.8.  Hemoglobin is 13.7. -Free lambda light chains are  259, kappa light chains 9.6 with ratio of 0.04. -We will see him back in 4 months with repeat myeloma labs. -I will plan to repeat imaging 1 year from the last 1. -I have given Keflex as he complained of having boil in the scrotal area.  Orders placed this encounter:  Orders Placed This Encounter  Procedures   CBC with Differential   Comprehensive metabolic panel   Protein electrophoresis, serum   Kappa/lambda light chains   Lactate dehydrogenase     Derek Jack, MD [place] [phone]   I, Milinda Antis, am acting as a scribe for Dr. Sanda Linger.  I, Derek Jack MD, have reviewed the above documentation for accuracy and completeness, and I agree with the above.

## 2019-12-18 IMAGING — MR MR THORACIC SPINE WO/W CM
4 of 8 series · 10 of 48 positions shown · IV contrast (gadavist)
Comparison: None.

CLINICAL DATA: Monoclonal gammopathy.

EXAM:
MRI THORACIC WITHOUT AND WITH CONTRAST
TECHNIQUE: Multiplanar and multiecho pulse sequences of the thoracic spine were
obtained without and with intravenous contrast.
CONTRAST:  7.5mL GADAVIST GADOBUTROL 1 MMOL/ML IV SOLN

[Series 5: T1 · sagittal · 4.0mm · 0.55mm/px · 2 of 13 slices shown (1 of 2)]
[im 1/13]
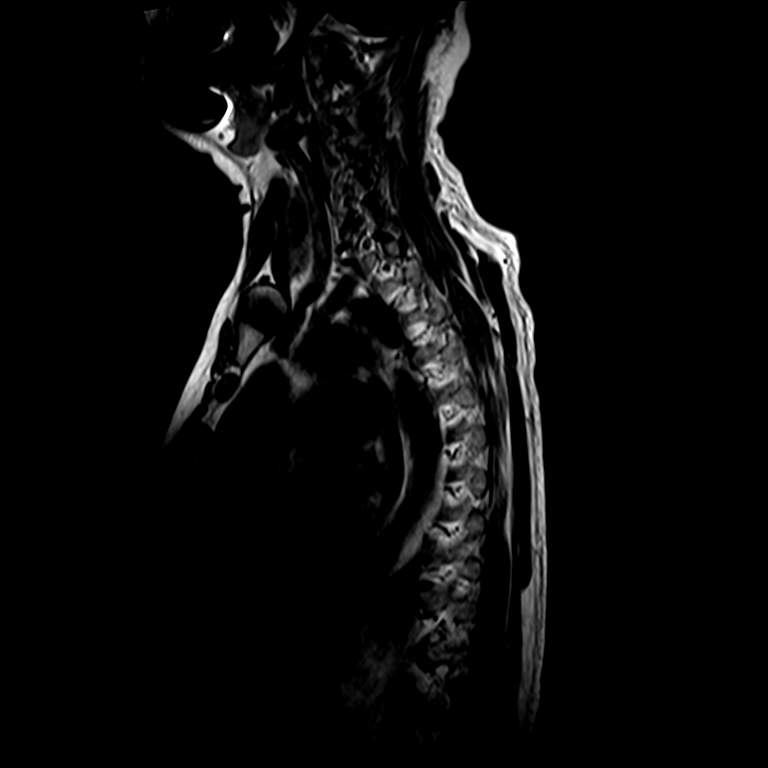
[im 13/13]
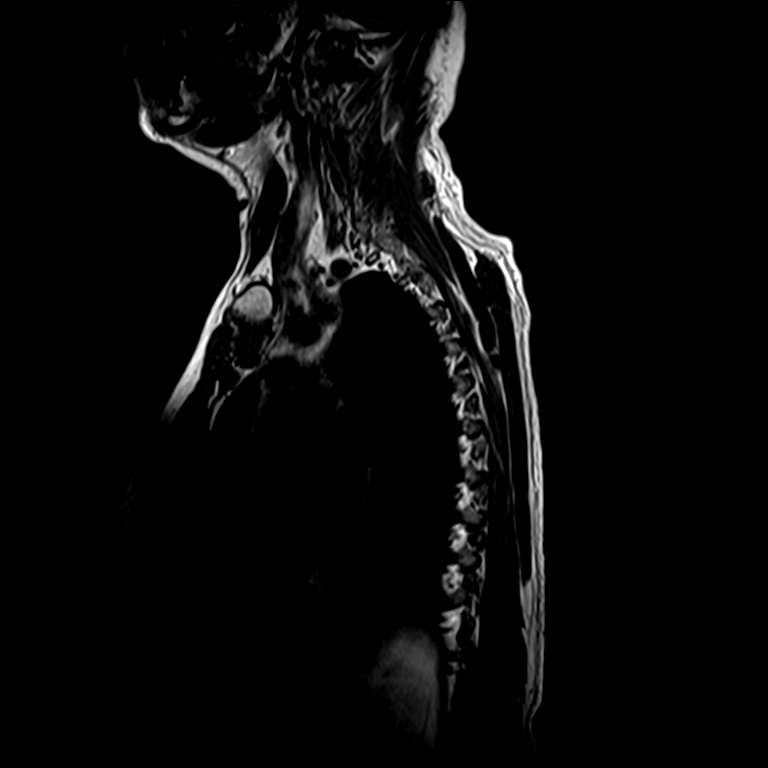

[Series 6: T1 · sagittal · 4.0mm · 0.42mm/px · 2 of 15 slices shown (2 of 2)]
[im 1/15]
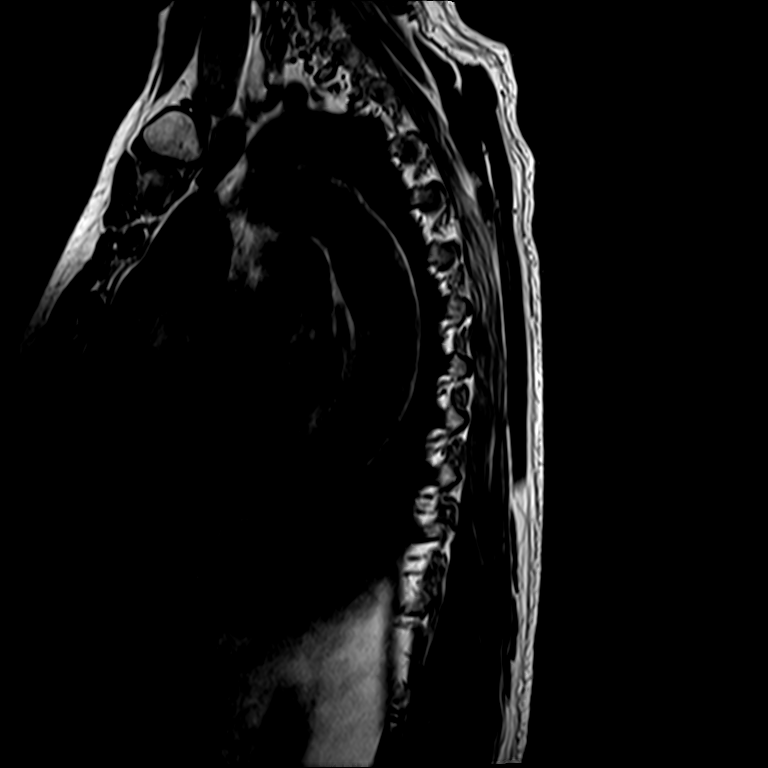
[im 10/15]
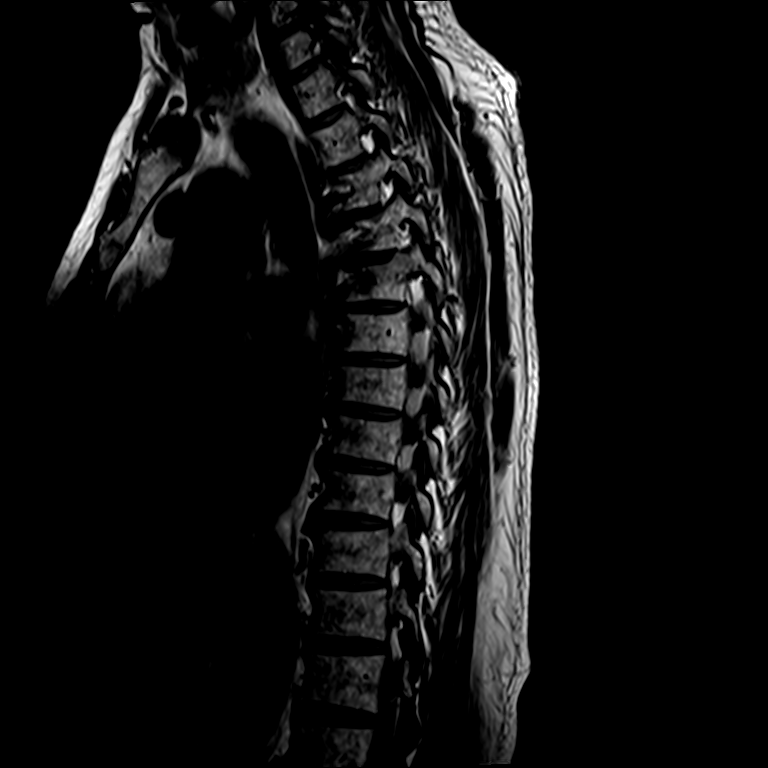

[Series 8: T2 · sagittal · 4.0mm · 0.42mm/px · 3 of 15 slices shown (1 of 2)]
[im 1/15]
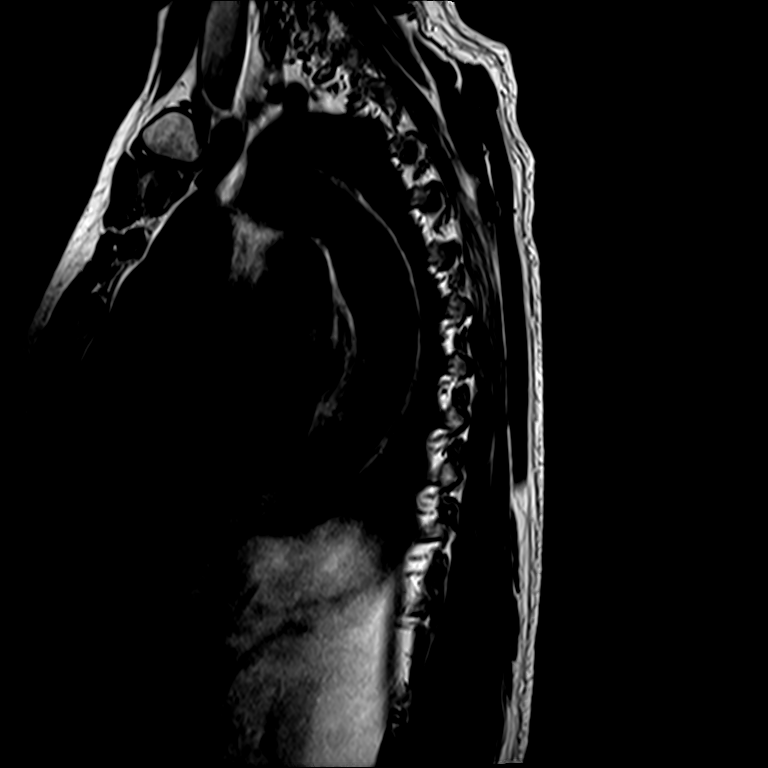
[im 10/15]
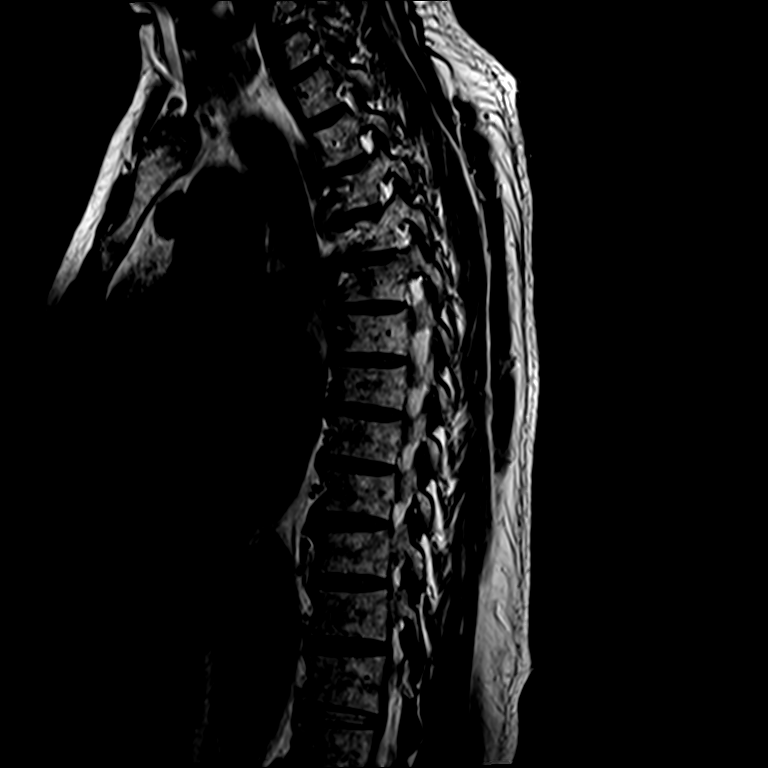
[im 15/15]
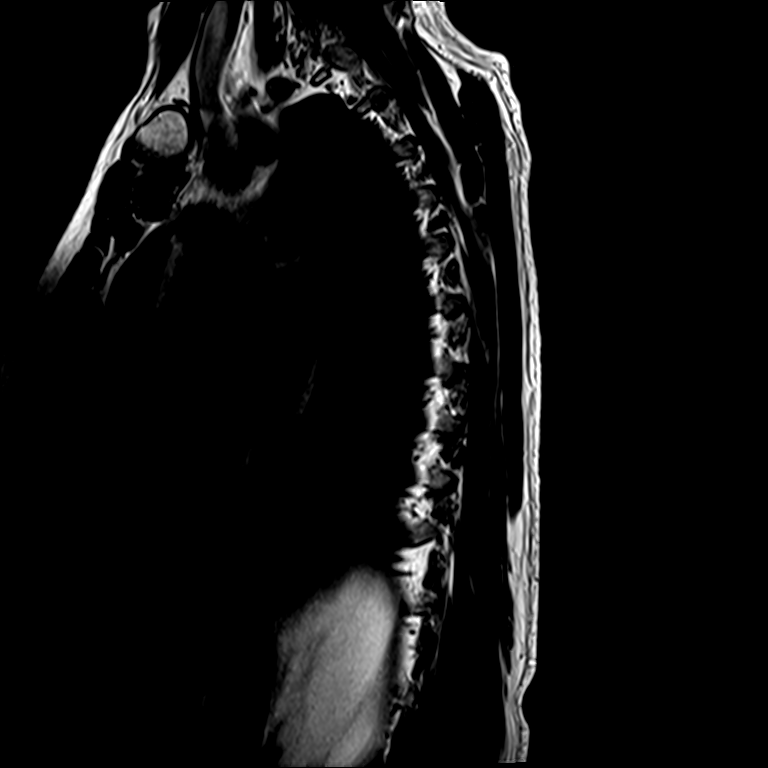

[Series 9: T2 · axial · 3.0mm · 0.34mm/px · z∈[-291,-125]mm · 3 of 36 slices shown (2 of 2)]
[im 5/36]
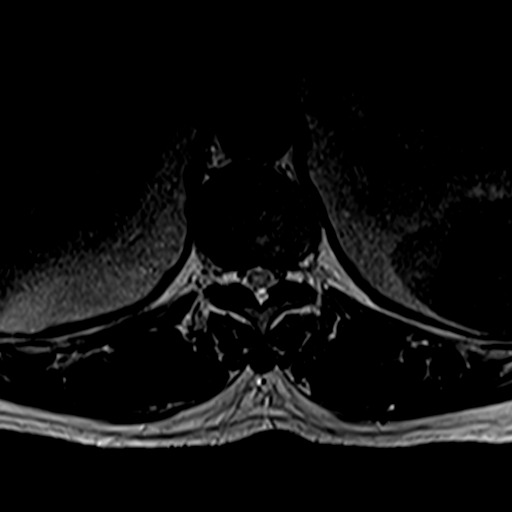
[im 18/36]
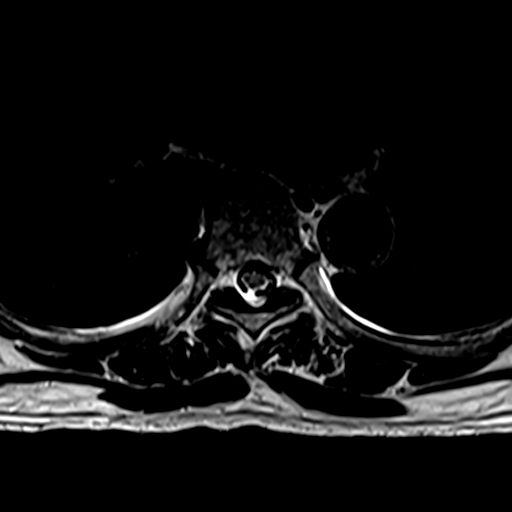
[im 31/36]
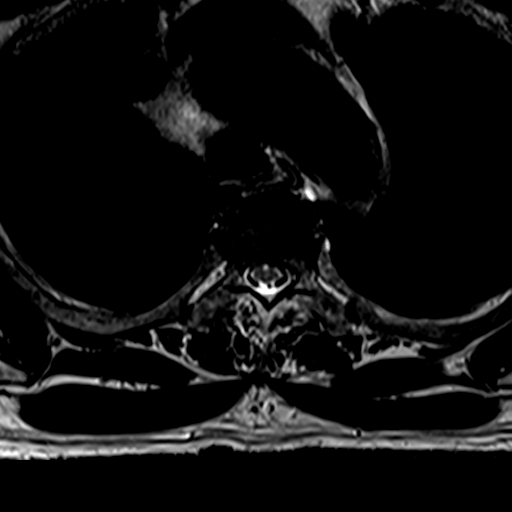

[10 of 48 positions shown; findings below may reference images not displayed]

FINDINGS: MRI THORACIC SPINE FINDINGS

Alignment:  Normal.

Vertebrae: There is no fracture or bone destruction. There is a
single area of low signal in the central portion of T9 on T1 and T2
weighted signal. It is isointense on STIR imaging. There is no
enhancement after contrast administration. This is not felt to be a
myeloma thus lesion or other significant abnormality. The bones of
the thoracic spine otherwise appear normal. There is no pathologic
enhancement after contrast administration.

Cord:  Thoracic spinal cord appears normal.

Paraspinal and other soft tissues: Normal.

Disc levels:

C6-7 through T6-7: Normal.

T7-8: Small disc bulge to the left of midline.

T8-9: Small disc bulge to the left of midline.

T9-10 and T10-11: Normal.

T11-12: Tiny central disc bulge with no neural impingement.

T12-L1: Normal.
IMPRESSION: No evidence of myeloma or other significant abnormality of the
thoracic spine.

## 2019-12-18 IMAGING — MR MR LUMBAR SPINE WO/W CM
4 of 7 series · 13 of 48 positions shown · IV contrast (gadavist)
Comparison: None.

CLINICAL DATA: Possible multiple myeloma.  No known symptoms.

EXAM:
MRI LUMBAR SPINE WITHOUT AND WITH CONTRAST
TECHNIQUE: Multiplanar and multiecho pulse sequences of the lumbar spine were
obtained without and with intravenous contrast.
CONTRAST:  7.5mL GADAVIST GADOBUTROL 1 MMOL/ML IV SOLN

[Series 2: T2 · sagittal · 4.0mm · 0.41mm/px · 3 of 17 slices shown (1 of 2)]
[im 1/17]
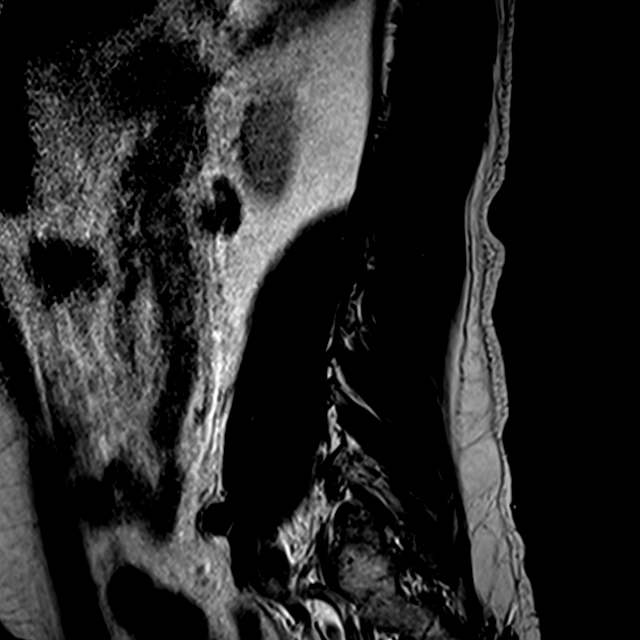
[im 9/17]
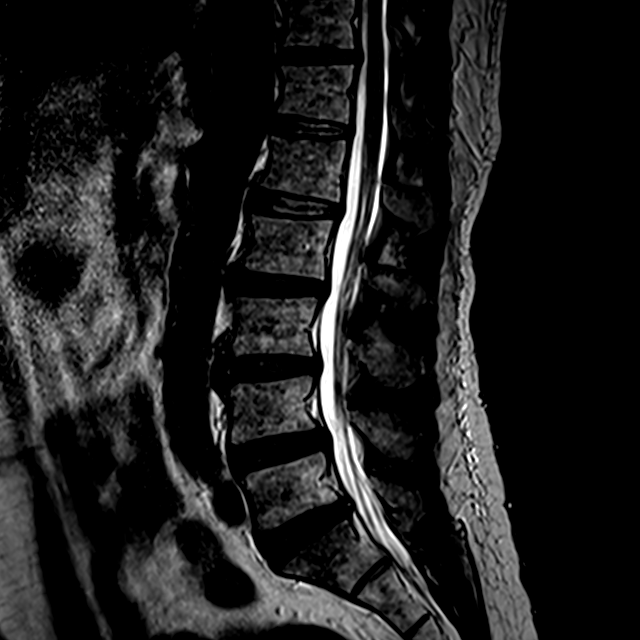
[im 17/17]
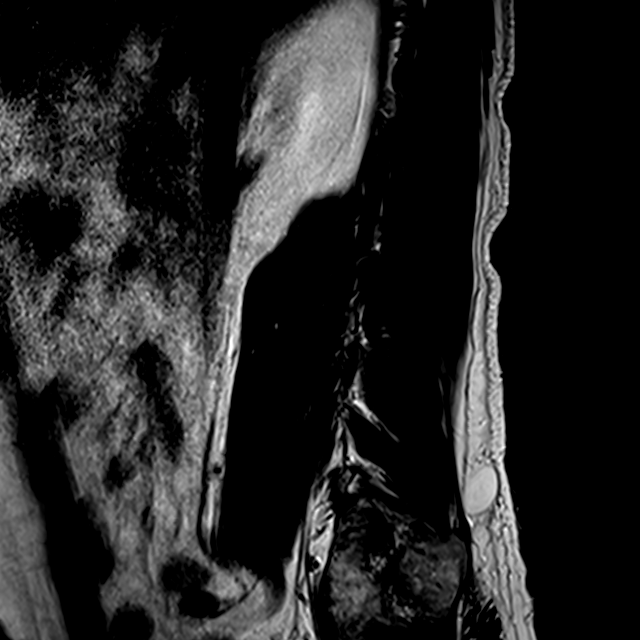

[Series 3: T1 · sagittal · 4.0mm · 0.41mm/px · 3 of 17 slices shown (1 of 2)]
[im 1/17]
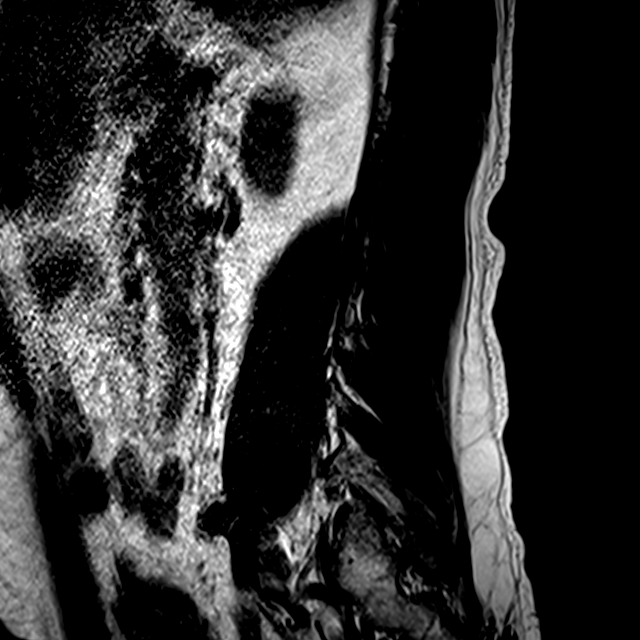
[im 11/17]
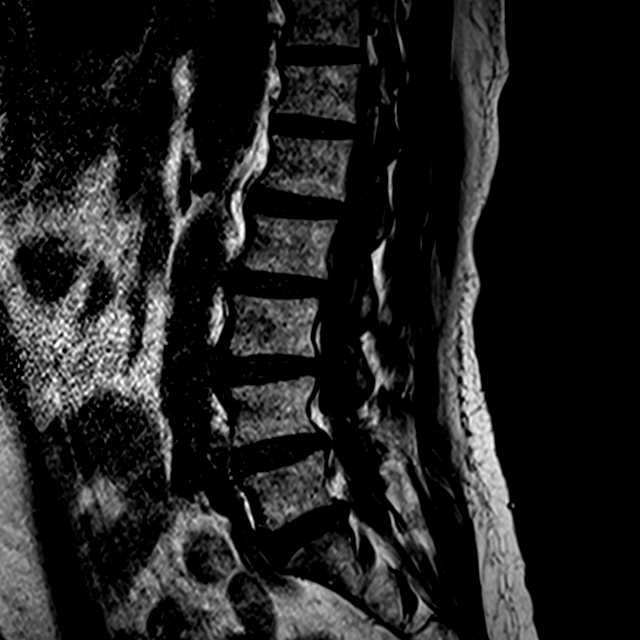
[im 17/17]
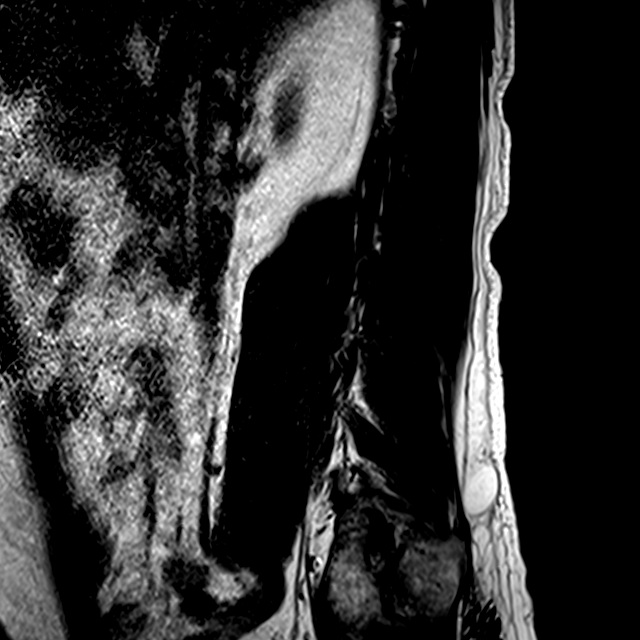

[Series 5: T2 · axial · 4.0mm · 0.33mm/px · z∈[-542,-358]mm · 4 of 43 slices shown (2 of 2)]
[im 1/43]
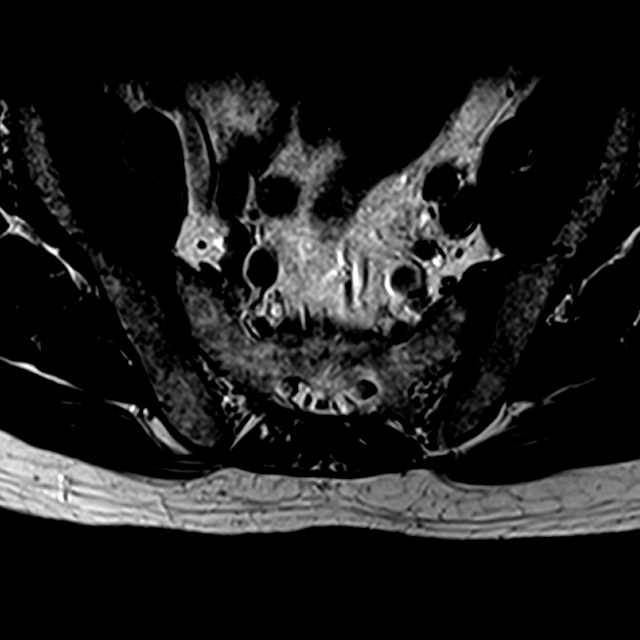
[im 5/43]
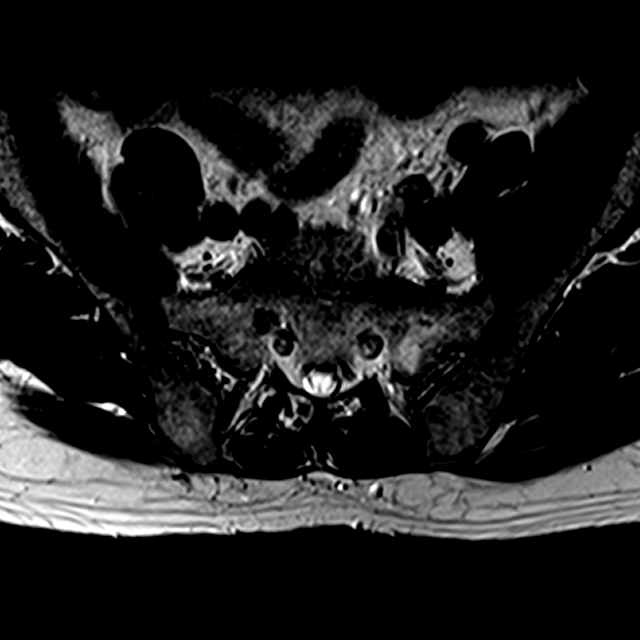
[im 22/43]
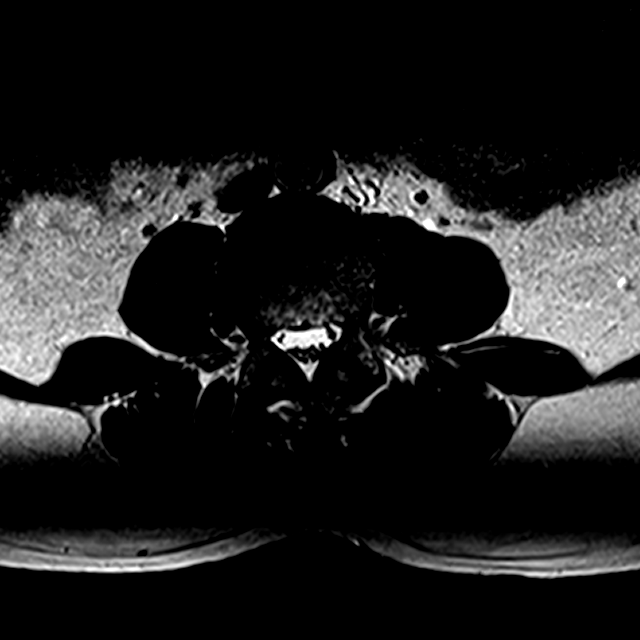
[im 38/43]
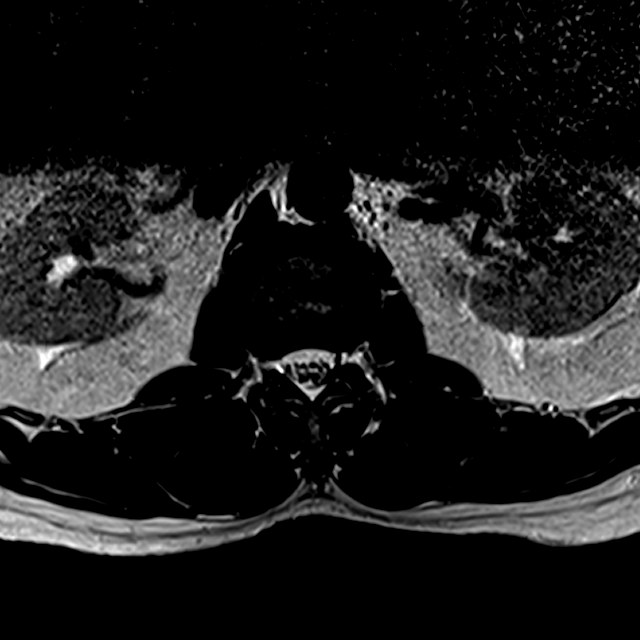

[Series 6: T1 · axial · 4.0mm · 0.33mm/px · z∈[-525,-358]mm · 3 of 43 slices shown (2 of 2)]
[im 5/43]
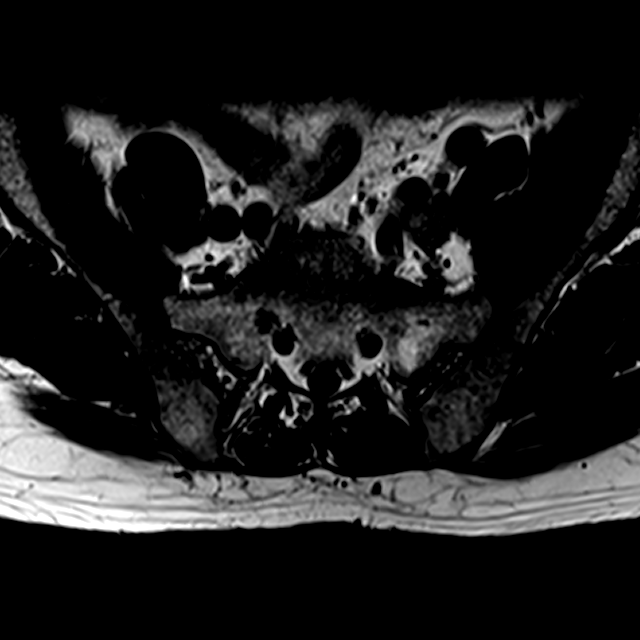
[im 22/43]
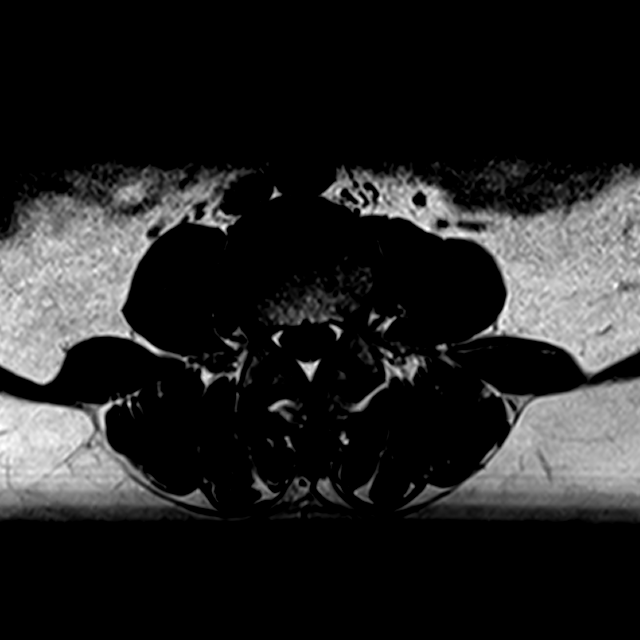
[im 38/43]
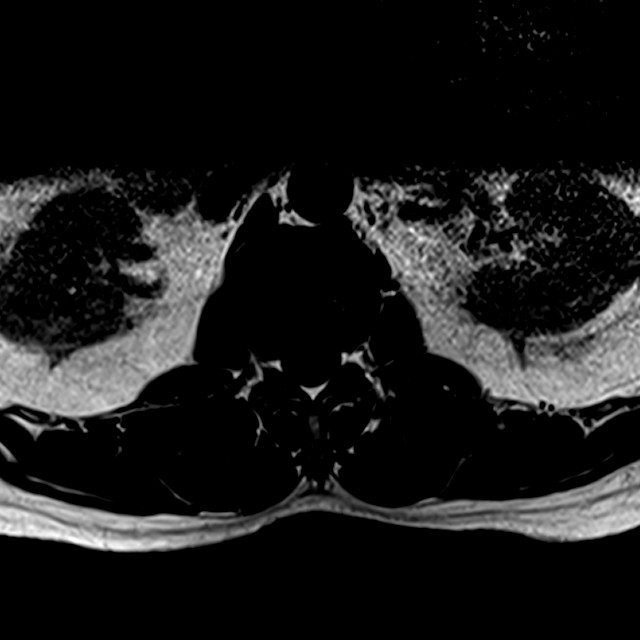

[13 of 48 positions shown; findings below may reference images not displayed]

FINDINGS: Segmentation:  Standard.

Alignment: Minimal grade 1 anterolisthesis of L4 on L5 secondary to
facet disease.

Vertebrae: No fracture, evidence of discitis, or aggressive bone
lesion.

Conus medullaris and cauda equina: Conus extends to the L1 level.
Conus and cauda equina appear normal.

Paraspinal and other soft tissues: No acute paraspinal abnormality.

Disc levels:

Disc spaces: Degenerative disease with disc height loss L2-3, L3-4,
L4-5 and L5-S1.

T12-L1: No significant disc bulge. No evidence of neural foraminal
stenosis. No central canal stenosis.

L1-L2: Minimal broad-based disc bulge. No evidence of neural
foraminal stenosis. No central canal stenosis.

L2-L3: Mild broad-based disc bulge with a broad shallow right
foraminal disc protrusion. No evidence of neural foraminal stenosis.
No central canal stenosis.

L3-L4: Broad-based disc bulge with a broad right foraminal/lateral
disc protrusion. Moderate bilateral facet arthropathy. No evidence
of neural foraminal stenosis. No central canal stenosis.

L4-L5: Broad-based disc bulge with a left foraminal annular fissure.
Severe left and moderate right facet arthropathy. Bilateral
subarticular recess narrowing. Mild bilateral foraminal stenosis.
Mild spinal stenosis.

L5-S1: Mild broad-based disc bulge with a small central disc
protrusion. No evidence of neural foraminal stenosis. No central
canal stenosis.
IMPRESSION: 1. No aggressive osseous lesion to suggest malignancy.
2. Lumbar spine spondylosis as described above.

## 2019-12-23 ENCOUNTER — Encounter (HOSPITAL_COMMUNITY): Payer: 59

## 2020-01-13 ENCOUNTER — Encounter (HOSPITAL_COMMUNITY): Payer: Self-pay | Admitting: *Deleted

## 2020-01-13 NOTE — Progress Notes (Signed)
Patient did not return call to reschedule virtual cardiac rehab follow-up visit. Patient has completed the virtual cardiac rehab program, and his VCR report will be scanned to media for review.  Sol Passer, MS, ACSM CEP

## 2020-02-19 ENCOUNTER — Ambulatory Visit (HOSPITAL_COMMUNITY)
Admission: EM | Admit: 2020-02-19 | Discharge: 2020-02-19 | Disposition: A | Discharge status: Home / Self Care | Payer: 59 | Attending: Urgent Care | Admitting: Urgent Care

## 2020-02-19 ENCOUNTER — Other Ambulatory Visit: Payer: Self-pay

## 2020-02-19 ENCOUNTER — Encounter (HOSPITAL_COMMUNITY): Payer: Self-pay | Admitting: Emergency Medicine

## 2020-02-19 DIAGNOSIS — L905 Scar conditions and fibrosis of skin: Secondary | ICD-10-CM

## 2020-02-19 DIAGNOSIS — L989 Disorder of the skin and subcutaneous tissue, unspecified: Secondary | ICD-10-CM

## 2020-02-19 DIAGNOSIS — R52 Pain, unspecified: Secondary | ICD-10-CM

## 2020-02-19 MED ORDER — CEPHALEXIN 500 MG PO CAPS: 500.0000 mg | ORAL_CAPSULE | Freq: Three times a day (TID) | ORAL | 0 refills | Status: DC

## 2020-02-19 NOTE — Discharge Instructions (Addendum)
I suspect that your scar is a keloid. It does not appear infected but will refer you to a dermatologist for a consult on this and your skin lesion of the face. To address your concern for infection you can use Keflex 3 times daily for 5 days. Please just use Tylenol at a dose of 500mg -650mg  once every 6 hours as needed for your aches, pains, fevers. Do not use any nonsteroidal anti-inflammatories (NSAIDs) like ibuprofen, Motrin, naproxen, Aleve, etc. which are all available over-the-counter.

## 2020-02-19 NOTE — ED Provider Notes (Signed)
[place]   MRN: [id number] DOB: 02-21-1956  Subjective:   Walter Perez is a 64 y.o. male presenting for several day hx of acute onset of pain from his surgical scar. Had quadruple CABG in Dec 2020. Has not had any issues with his scar until now. Denies fever, drainage of pus, bleeding, redness. Has felt it warm at times.   No current facility-administered medications for this encounter.  Current Outpatient Medications:  .  aspirin 81 MG EC tablet, Take by mouth., Disp: , Rfl:  .  atorvastatin (LIPITOR) 80 MG tablet, Take 80 mg by mouth daily at 6 PM. , Disp: , Rfl:  .  cephALEXin (KEFLEX) 500 MG capsule, Take 1 capsule (500 mg total) by mouth 2 (two) times daily., Disp: 10 capsule, Rfl: 0 .  clopidogrel (PLAVIX) 75 MG tablet, Take 75 mg by mouth daily. , Disp: , Rfl:  .  metoprolol tartrate (LOPRESSOR) 25 MG tablet, Take by mouth., Disp: , Rfl:  .  nitroGLYCERIN (NITROSTAT) 0.4 MG SL tablet, , Disp: , Rfl:  .  olmesartan-hydrochlorothiazide (BENICAR HCT) 40-25 MG tablet, Take 1 tablet by mouth daily., Disp: , Rfl:    No Known Allergies  Past Medical History:  Diagnosis Date  . Coronary artery disease   . Hyperlipidemia   . Hypertension   . Myocardial infarction ([place]) 06/19/2019     Past Surgical History:  Procedure Laterality Date  . CORONARY ARTERY BYPASS GRAFT  06/19/2019   quadruple    Family History  Problem Relation Age of Onset  . Stroke Mother   . Stroke Father     Social History   Tobacco Use  . Smoking status: Never Smoker  . Smokeless tobacco: Never Used  Substance Use Topics  . Alcohol use: Never  . Drug use: Never    ROS   Objective:   Vitals: BP 130/80 (BP Location: Left Arm)   Pulse 63   Temp 98.3 F (36.8 C) (Oral)   Resp 18   SpO2 99%   Physical Exam Constitutional:      General: He is not in acute distress.    Appearance: Normal appearance. He is well-developed and normal weight. He is not ill-appearing, toxic-appearing  or diaphoretic.  HENT:     Head: Normocephalic and atraumatic.      Right Ear: External ear normal.     Left Ear: External ear normal.     Nose: Nose normal.     Mouth/Throat:     Pharynx: Oropharynx is clear.  Eyes:     General: No scleral icterus.       Right eye: No discharge.        Left eye: No discharge.     Extraocular Movements: Extraocular movements intact.     Pupils: Pupils are equal, round, and reactive to light.  Cardiovascular:     Rate and Rhythm: Normal rate.  Pulmonary:     Effort: Pulmonary effort is normal.  Chest:    Musculoskeletal:     Cervical back: Normal range of motion.  Skin:    General: Skin is warm and dry.  Neurological:     Mental Status: He is alert and oriented to person, place, and time.  Psychiatric:        Mood and Affect: Mood normal.        Behavior: Behavior normal.        Thought Content: Thought content normal.        Judgment: Judgment normal.  Assessment and Plan :   PDMP not reviewed this encounter.  1. Pain in surgical scar   2. Skin lesion     Suspect tenderness of his keloid. But patient is very concerned about infection. Will use Keflex to address his concerns. Needs a consult with a dermatologist for his keloid but also needs biopsy on skin lesion from his right face. Recommended APAP for his pain. Counseled patient on potential for adverse effects with medications prescribed/recommended today, ER and return-to-clinic precautions discussed, patient verbalized understanding.    Jaynee Eagles, [place] 02/19/20 [id number]

## 2020-02-19 NOTE — ED Triage Notes (Signed)
Pt presents to [facility] for assessment of open heart surgery scar to chest from December.  States int he past week he has been experiencing worsening sensitivy to the skin with some bubbling of the incision.  Denies fevers, denies chest pain, denies SOB

## 2020-03-05 ENCOUNTER — Ambulatory Visit: Payer: 59 | Admitting: Physician Assistant

## 2020-03-31 ENCOUNTER — Other Ambulatory Visit: Payer: Self-pay

## 2020-03-31 ENCOUNTER — Ambulatory Visit (INDEPENDENT_AMBULATORY_CARE_PROVIDER_SITE_OTHER): Payer: 59 | Admitting: Dermatology

## 2020-03-31 DIAGNOSIS — L309 Dermatitis, unspecified: Secondary | ICD-10-CM | POA: Diagnosis not present

## 2020-03-31 DIAGNOSIS — D485 Neoplasm of uncertain behavior of skin: Secondary | ICD-10-CM

## 2020-03-31 DIAGNOSIS — L910 Hypertrophic scar: Secondary | ICD-10-CM

## 2020-03-31 MED ORDER — TRIAMCINOLONE ACETONIDE 0.1 % EX CREA: 1.0000 "application " | TOPICAL_CREAM | Freq: Every day | CUTANEOUS | 3 refills | Status: DC | PRN

## 2020-03-31 MED ORDER — TRIAMCINOLONE ACETONIDE 40 MG/ML IJ SUSP
40.0000 mg | Freq: Once | INTRAMUSCULAR | Status: AC
  Administered 2020-03-31: 40 mg

## 2020-03-31 NOTE — Patient Instructions (Signed)

## 2020-04-13 ENCOUNTER — Inpatient Hospital Stay (HOSPITAL_COMMUNITY): Payer: 59 | Attending: Hematology

## 2020-04-13 ENCOUNTER — Other Ambulatory Visit: Payer: Self-pay

## 2020-04-13 DIAGNOSIS — Z823 Family history of stroke: Secondary | ICD-10-CM | POA: Diagnosis not present

## 2020-04-13 DIAGNOSIS — E785 Hyperlipidemia, unspecified: Secondary | ICD-10-CM | POA: Insufficient documentation

## 2020-04-13 DIAGNOSIS — C9000 Multiple myeloma not having achieved remission: Secondary | ICD-10-CM | POA: Insufficient documentation

## 2020-04-13 DIAGNOSIS — I252 Old myocardial infarction: Secondary | ICD-10-CM | POA: Diagnosis not present

## 2020-04-13 DIAGNOSIS — Z79899 Other long term (current) drug therapy: Secondary | ICD-10-CM | POA: Diagnosis not present

## 2020-04-13 DIAGNOSIS — I2510 Atherosclerotic heart disease of native coronary artery without angina pectoris: Secondary | ICD-10-CM | POA: Diagnosis not present

## 2020-04-13 DIAGNOSIS — D472 Monoclonal gammopathy: Secondary | ICD-10-CM

## 2020-04-13 DIAGNOSIS — E669 Obesity, unspecified: Secondary | ICD-10-CM | POA: Diagnosis not present

## 2020-04-13 DIAGNOSIS — Z23 Encounter for immunization: Secondary | ICD-10-CM | POA: Diagnosis not present

## 2020-04-13 LAB — CBC WITH DIFFERENTIAL/PLATELET
Abs Immature Granulocytes: 0.02 10*3/uL (ref 0.00–0.07)
Basophils Absolute: 0 10*3/uL (ref 0.0–0.1)
Basophils Relative: 1 %
Eosinophils Absolute: 0.4 10*3/uL (ref 0.0–0.5)
Eosinophils Relative: 7 %
HCT: 41.4 % (ref 39.0–52.0)
Hemoglobin: 13.7 g/dL (ref 13.0–17.0)
Immature Granulocytes: 0 %
Lymphocytes Relative: 23 %
Lymphs Abs: 1.3 10*3/uL (ref 0.7–4.0)
MCH: 28.7 pg (ref 26.0–34.0)
MCHC: 33.1 g/dL (ref 30.0–36.0)
MCV: 86.8 fL (ref 80.0–100.0)
Monocytes Absolute: 0.5 10*3/uL (ref 0.1–1.0)
Monocytes Relative: 8 %
Neutro Abs: 3.3 10*3/uL (ref 1.7–7.7)
Neutrophils Relative %: 61 %
Platelets: 175 10*3/uL (ref 150–400)
RBC: 4.77 MIL/uL (ref 4.22–5.81)
RDW: 13 % (ref 11.5–15.5)
WBC: 5.5 10*3/uL (ref 4.0–10.5)
nRBC: 0 % (ref 0.0–0.2)

## 2020-04-13 LAB — COMPREHENSIVE METABOLIC PANEL
ALT: 23 U/L (ref 0–44)
AST: 24 U/L (ref 15–41)
Albumin: 4 g/dL (ref 3.5–5.0)
Alkaline Phosphatase: 56 U/L (ref 38–126)
Anion gap: 9 (ref 5–15)
BUN: 14 mg/dL (ref 8–23)
CO2: 30 mmol/L (ref 22–32)
Calcium: 8.8 mg/dL — ABNORMAL LOW (ref 8.9–10.3)
Chloride: 97 mmol/L — ABNORMAL LOW (ref 98–111)
Creatinine, Ser: 1.13 mg/dL (ref 0.61–1.24)
GFR, Estimated: >60 mL/min (ref >60)
Glucose, Bld: 104 mg/dL — ABNORMAL HIGH (ref 70–99)
Potassium: 3.3 mmol/L — ABNORMAL LOW (ref 3.5–5.1)
Sodium: 136 mmol/L (ref 135–145)
Total Bilirubin: 1 mg/dL (ref 0.3–1.2)
Total Protein: 7.7 g/dL (ref 6.5–8.1)

## 2020-04-13 LAB — LACTATE DEHYDROGENASE: LDH: 157 U/L (ref 98–192)

## 2020-04-14 LAB — PROTEIN ELECTROPHORESIS, SERUM
A/G Ratio: 1.1 (ref 0.7–1.7)
Albumin ELP: 3.9 g/dL (ref 2.9–4.4)
Alpha-1-Globulin: 0.2 g/dL (ref 0.0–0.4)
Alpha-2-Globulin: 0.6 g/dL (ref 0.4–1.0)
Beta Globulin: 0.8 g/dL (ref 0.7–1.3)
Gamma Globulin: 2.1 g/dL — ABNORMAL HIGH (ref 0.4–1.8)
Globulin, Total: 3.7 g/dL (ref 2.2–3.9)
M-Spike, %: 2 g/dL — ABNORMAL HIGH
Total Protein ELP: 7.6 g/dL (ref 6.0–8.5)

## 2020-04-14 LAB — KAPPA/LAMBDA LIGHT CHAINS
Kappa free light chain: 8.9 mg/L (ref 3.3–19.4)
Kappa, lambda light chain ratio: 0.03 — ABNORMAL LOW (ref 0.26–1.65)
Lambda free light chains: 269 mg/L — ABNORMAL HIGH (ref 5.7–26.3)

## 2020-04-20 ENCOUNTER — Inpatient Hospital Stay (HOSPITAL_BASED_OUTPATIENT_CLINIC_OR_DEPARTMENT_OTHER): Payer: 59 | Admitting: Hematology

## 2020-04-20 ENCOUNTER — Encounter (HOSPITAL_COMMUNITY): Payer: Self-pay | Admitting: Hematology

## 2020-04-20 ENCOUNTER — Other Ambulatory Visit: Payer: Self-pay

## 2020-04-20 VITALS — BP 145/96 | HR 63 | Temp 98.4°F | Resp 18 | Wt 193.0 lb

## 2020-04-20 DIAGNOSIS — C9000 Multiple myeloma not having achieved remission: Secondary | ICD-10-CM | POA: Diagnosis not present

## 2020-04-20 DIAGNOSIS — D472 Monoclonal gammopathy: Secondary | ICD-10-CM

## 2020-04-20 MED ORDER — INFLUENZA VAC SPLIT QUAD 0.5 ML IM SUSY
0.5000 mL | PREFILLED_SYRINGE | Freq: Once | INTRAMUSCULAR | Status: AC
  Administered 2020-04-20: 0.5 mL via INTRAMUSCULAR
  Filled 2020-04-20: qty 0.5

## 2020-04-20 NOTE — Progress Notes (Signed)
Patient tolerated flu vaccine with no complaints voiced.  Site clean and dry with no bruising or swelling noted at site.  Band aid applied.  VSS. 

## 2020-04-20 NOTE — Progress Notes (Signed)
[address] [place], [place] [address]   CLINIC:  Medical Oncology/Hematology  PCP:  Patient, No Pcp Per None  None  REASON FOR VISIT:  Follow-up for smoldering myeloma  PRIOR THERAPY: None  CURRENT THERAPY: Observation  INTERVAL HISTORY:  Mr. Walter Perez, a 64 y.o. male, returns for routine follow-up for his smoldering myeloma. Walter Perez was last seen on 12/16/2019.  Today he reports feeling well. He denies having any issues since the last visit. He denies having any leg swelling.  He is retired now. He got his flu shot today.   REVIEW OF SYSTEMS:  Review of Systems  Constitutional: Negative for appetite change and fatigue.  Musculoskeletal: Negative for arthralgias.  All other systems reviewed and are negative.   PAST MEDICAL/SURGICAL HISTORY:  Past Medical History:  Diagnosis Date   Coronary artery disease    Hyperlipidemia    Hypertension    Myocardial infarction ([facility]) 06/19/2019   Past Surgical History:  Procedure Laterality Date   CORONARY ARTERY BYPASS GRAFT  06/19/2019   quadruple    SOCIAL HISTORY:  Social History   Socioeconomic History   Marital status: Married    Spouse name: Not on file   Number of children: 2   Years of education: Not on file   Highest education level: Not on file  Occupational History   Occupation: Unemployed  Tobacco Use   Smoking status: Never Smoker   Smokeless tobacco: Never Used  Substance and Sexual Activity   Alcohol use: Never   Drug use: Never   Sexual activity: Yes  Other Topics Concern   Not on file  Social History Narrative   Not on file   Social Determinants of Health   Financial Resource Strain:    Difficulty of Paying Living Expenses: Not on file  Food Insecurity:    Worried About Charity fundraiser in the Last Year: Not on file   [employer] of Food in the Last Year: Not on file  Transportation Needs:    Lack of Transportation (Medical): Not on file     Lack of Transportation (Non-Medical): Not on file  Physical Activity:    Days of Exercise per Week: Not on file   Minutes of Exercise per Session: Not on file  Stress:    Feeling of Stress : Not on file  Social Connections:    Frequency of Communication with Friends and Family: Not on file   Frequency of Social Gatherings with Friends and Family: Not on file   Attends Religious Services: Not on file   Active Member of Clubs or Organizations: Not on file   Attends Archivist Meetings: Not on file   Marital Status: Not on file  Intimate Partner Violence:    Fear of Current or Ex-Partner: Not on file   Emotionally Abused: Not on file   Physically Abused: Not on file   Sexually Abused: Not on file    FAMILY HISTORY:  Family History  Problem Relation Age of Onset   Stroke Mother    Stroke Father     CURRENT MEDICATIONS:  Current Outpatient Medications  Medication Sig Dispense Refill   aspirin 81 MG EC tablet Take 81 mg by mouth daily.      atorvastatin (LIPITOR) 80 MG tablet Take 80 mg by mouth daily at 6 PM.      clopidogrel (PLAVIX) 75 MG tablet Take 75 mg by mouth daily.      metoprolol tartrate (  LOPRESSOR) 25 MG tablet Take 25 mg by mouth. Pt takes 1 1/2 tablets every 8 hours     nitroGLYCERIN (NITROSTAT) 0.4 MG SL tablet  (Patient not taking: Reported on 03/31/2020)     olmesartan-hydrochlorothiazide (BENICAR HCT) 40-25 MG tablet Take 1 tablet by mouth daily.     triamcinolone cream (KENALOG) 0.1 % Apply 1 application topically daily as needed. 453 g 3   No current facility-administered medications for this visit.    ALLERGIES:  No Known Allergies  PHYSICAL EXAM:  Performance status (ECOG): 0 - Asymptomatic  Vitals:   04/20/20 1103  BP: (!) 145/96  Pulse: 63  Resp: 18  Temp: 98.4 F (36.9 C)  SpO2: 100%   Wt Readings from Last 3 Encounters:  04/20/20 193 lb (87.5 kg)  12/16/19 191 lb 9.6 oz (86.9 kg)  09/04/19 190 lb 4.1 oz  (86.3 kg)   Physical Exam Vitals reviewed.  Constitutional:      Appearance: Normal appearance. He is obese.  Cardiovascular:     Rate and Rhythm: Normal rate and regular rhythm.     Pulses: Normal pulses.     Heart sounds: Normal heart sounds.  Pulmonary:     Effort: Pulmonary effort is normal.     Breath sounds: Normal breath sounds.  Abdominal:     Palpations: Abdomen is soft. There is no hepatomegaly, splenomegaly or mass.     Tenderness: There is no abdominal tenderness.     Hernia: No hernia is present.  Musculoskeletal:     Right lower leg: No edema.     Left lower leg: No edema.  Lymphadenopathy:     Upper Body:     Right upper body: No supraclavicular, axillary or pectoral adenopathy.     Left upper body: No supraclavicular, axillary or pectoral adenopathy.  Neurological:     General: No focal deficit present.     Mental Status: He is alert and oriented to person, place, and time.  Psychiatric:        Mood and Affect: Mood normal.        Behavior: Behavior normal.     LABORATORY DATA:  I have reviewed the labs as listed.  CBC Latest Ref Rng & Units 04/13/2020 12/08/2019 07/07/2019  WBC 4.0 - 10.5 K/uL 5.5 6.1 7.8  Hemoglobin 13.0 - 17.0 g/dL 13.7 13.7 12.0(L)  Hematocrit 39 - 52 % 41.4 41.0 38.6(L)  Platelets 150 - 400 K/uL 175 183 313   CMP Latest Ref Rng & Units 04/13/2020 12/08/2019 07/07/2019  Glucose 70 - 99 mg/dL 104(H) 111(H) 99  BUN 8 - 23 mg/dL 14 19 14   Creatinine 0.61 - 1.24 mg/dL 1.13 1.15 1.08  Sodium 135 - 145 mmol/L 136 135 136  Potassium 3.5 - 5.1 mmol/L 3.3(L) 3.3(L) 3.9  Chloride 98 - 111 mmol/L 97(L) 97(L) 101  CO2 22 - 32 mmol/L 30 27 26   Calcium 8.9 - 10.3 mg/dL 8.8(L) 8.8(L) 8.6(L)  Total Protein 6.5 - 8.1 g/dL 7.7 8.4(H) 8.0  Total Bilirubin 0.3 - 1.2 mg/dL 1.0 1.4(H) 0.8  Alkaline Phos 38 - 126 U/L 56 63 78  AST 15 - 41 U/L 24 26 15   ALT 0 - 44 U/L 23 26 29       Component Value Date/Time   RBC 4.77 04/13/2020 1038   MCV 86.8  04/13/2020 1038   MCH 28.7 04/13/2020 1038   MCHC 33.1 04/13/2020 1038   RDW 13.0 04/13/2020 1038   LYMPHSABS 1.3 04/13/2020 1038  MONOABS 0.5 04/13/2020 1038   EOSABS 0.4 04/13/2020 1038   BASOSABS 0.0 04/13/2020 1038   Lab Results  Component Value Date   LDH 157 04/13/2020   LDH 188 12/08/2019   LDH 171 07/07/2019   Lab Results  Component Value Date   TOTALPROTELP 7.6 04/13/2020   ALBUMINELP 3.9 04/13/2020   A1GS 0.2 04/13/2020   A2GS 0.6 04/13/2020   BETS 0.8 04/13/2020   GAMS 2.1 (H) 04/13/2020   MSPIKE 2.0 (H) 04/13/2020   SPEI Comment 04/13/2020    Lab Results  Component Value Date   KPAFRELGTCHN 8.9 04/13/2020   LAMBDASER 269.0 (H) 04/13/2020   KAPLAMBRATIO 0.03 (L) 04/13/2020    DIAGNOSTIC IMAGING:  I have independently reviewed the scans and discussed with the patient. No results found.   ASSESSMENT:  1. 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  Lambda light chains 303, ratio of 0.03.  LDH normal.  Beta-2 microglobulin 1.7.  Serum viscosity normal.  Hemoglobin, calcium and creatinine normal. -Bone marrow biopsy on 04/30/2019 showed atypical plasma cells representing 12% in the aspirate.  Plasma cells display lambda light chain restriction. -Myeloma FISH panel shows gain of 1 q.  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 -24-hour urine negative for immunofixation and UPEP.  Urine total protein was undetectable. -Skeletal survey on 05/09/2019 did not show any evidence of lytic lesions.  Insurance refused PET CT scan. -MRI of the thoracic, lumbar spine and pelvis with and without contrast on 06/18/2019 did not show any evidence of myeloma. -Based on Mayo 2018 risk stratification system, his M spike is more than 2 g/dL.  Involved/uninvolved free light chain ratio was more than 20.  Bone marrow plasma cells were less than 20%.  With 2 risk factors, he was considered high risk with estimated median time to progression of 29  months, estimated risk of progression of 24 %/year during first 2 years, 11 %/year for the next 3 years, 3 %/year for the next 5 years.  2.  CAD: -Status post CABG at [facility] in December 2020.   PLAN:  1. 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  No B symptoms. -Reviewed myeloma labs from 04/13/2020.  M spike is stable at 2.0.  Creatinine is 1.13 and calcium 8.8.  Hemoglobin is normal at 13.7. -Free lambda light chains are 269 and ratio is 0.03.  No significant change. -Because of his high risk smoldering myeloma, I recommend follow-up in 4 months.  We will plan to repeat labs.  We will also plan to repeat skeletal survey prior to next visit.  Orders placed this encounter:  Orders Placed This Encounter  Procedures   DG Bone Survey Met   CBC with Differential/Platelet   Comprehensive metabolic panel   Lactate dehydrogenase   Kappa/lambda light chains   Protein electrophoresis, serum     Derek Jack, MD [place] [id number]   I, Milinda Antis, am acting as a scribe for Dr. Sanda Linger.  I, Derek Jack MD, have reviewed the above documentation for accuracy and completeness, and I agree with the above.

## 2020-04-20 NOTE — Patient Instructions (Signed)
[place] at [facility] Discharge Instructions  You were seen today by Dr. Delton Coombes. He went over your recent results and scans. You will be scheduled for X-rays of your bones prior to next visit. Dr. Delton Coombes will see you back in 4 months for labs and follow up.   Thank you for choosing [place] at [facility] to provide your oncology and hematology care.  To afford each patient quality time with our provider, please arrive at least 15 minutes before your scheduled appointment time.   If you have a lab appointment with the [facility] please come in thru the Main Entrance and check in at the main information desk  You need to re-schedule your appointment should you arrive 10 or more minutes late.  We strive to give you quality time with our providers, and arriving late affects you and other patients whose appointments are after yours.  Also, if you no show three or more times for appointments you may be dismissed from the clinic at the providers discretion.     Again, thank you for choosing [facility].  Our hope is that these requests will decrease the amount of time that you wait before being seen by our physicians.       _____________________________________________________________  Should you have questions after your visit to [facility], please contact our office at [phone] between the hours of 8:00 a.m. and 4:30 p.m.  Voicemails left after 4:00 p.m. will not be returned until the following business day.  For prescription refill requests, have your pharmacy contact our office and allow 72 hours.    Cancer Center Support Programs:   > Cancer Support Group  2nd Tuesday of the month 1pm-2pm, Journey Room

## 2020-04-21 ENCOUNTER — Encounter: Payer: Self-pay | Admitting: Physician Assistant

## 2020-04-21 ENCOUNTER — Ambulatory Visit (INDEPENDENT_AMBULATORY_CARE_PROVIDER_SITE_OTHER): Payer: 59 | Admitting: Physician Assistant

## 2020-04-21 VITALS — BP 120/82 | HR 73 | Temp 98.4°F | Resp 16 | Ht 66.5 in | Wt 194.0 lb

## 2020-04-21 DIAGNOSIS — E782 Mixed hyperlipidemia: Secondary | ICD-10-CM | POA: Diagnosis not present

## 2020-04-21 DIAGNOSIS — K1379 Other lesions of oral mucosa: Secondary | ICD-10-CM

## 2020-04-21 DIAGNOSIS — I2510 Atherosclerotic heart disease of native coronary artery without angina pectoris: Secondary | ICD-10-CM | POA: Diagnosis not present

## 2020-04-21 DIAGNOSIS — D472 Monoclonal gammopathy: Secondary | ICD-10-CM

## 2020-04-21 MED ORDER — OLMESARTAN MEDOXOMIL-HCTZ 40-25 MG PO TABS: 1.0000 | ORAL_TABLET | Freq: Every day | ORAL | 1 refills | Status: DC

## 2020-04-21 MED ORDER — CLOPIDOGREL BISULFATE 75 MG PO TABS: 75.0000 mg | ORAL_TABLET | Freq: Every day | ORAL | 1 refills | Status: DC

## 2020-04-21 MED ORDER — MAGIC MOUTHWASH: 5.0000 mL | Freq: Three times a day (TID) | ORAL | 0 refills | Status: DC | PRN

## 2020-04-21 MED ORDER — ATORVASTATIN CALCIUM 80 MG PO TABS: 80.0000 mg | ORAL_TABLET | Freq: Every day | ORAL | 1 refills | Status: DC

## 2020-04-21 MED ORDER — METOPROLOL TARTRATE 25 MG PO TABS: ORAL_TABLET | ORAL | 5 refills | Status: DC

## 2020-04-21 NOTE — Progress Notes (Signed)
Patient presents to clinic today to establish care. Has been followed by [facility] providers in the area as PCP. Patient with history of CAD with prior MI s/p CABG x 4. Followed by Cardiology (Dr. Lamonte Sakai with [facility]) and currently on a regimen of Atorvastatin 80 mg QD, . Benicar-HCT 40-25 mg QD, Lopressor 37.5 mg TID (per Cardiology) and Plavix 75 mg daily. Is out of medication. Needs new specialist since current specialist does not accept his new insurance. Patient denies chest pain, palpitations, lightheadedness, dizziness, vision changes or frequent headaches.  BP Readings from Last 3 Encounters:  04/21/20 120/82  04/20/20 (!) 145/96  02/19/20 130/80   Patient also diagnosed recently with smoldering myeloma. Is followed by Oncology, Dr. Delton Coombes, whom he saw yesterday. Had updated lab and is scheduled for x-rays.   Patient endorses sore area of left inner cheek x 2 weeks. Notes is not worsening but not going away. Notes the area seems irritated and mildly swollen. Denies any tooth pain, injury. Denies sinus symptoms of ear pain of same side. Has not tried anything OTC for symptoms.   Past Medical History:  Diagnosis Date  . Coronary artery disease   . Hyperlipidemia   . Hypertension   . Myocardial infarction ([place]) 06/19/2019    Current Outpatient Medications on File Prior to Visit  Medication Sig Dispense Refill  . aspirin 81 MG EC tablet Take 81 mg by mouth daily.     Marland Kitchen atorvastatin (LIPITOR) 80 MG tablet Take 80 mg by mouth daily at 6 PM.     . clopidogrel (PLAVIX) 75 MG tablet Take 75 mg by mouth daily.     . metoprolol tartrate (LOPRESSOR) 25 MG tablet Take 25 mg by mouth. Pt takes 1 1/2 tablets every 8 hours    . nitroGLYCERIN (NITROSTAT) 0.4 MG SL tablet     . olmesartan-hydrochlorothiazide (BENICAR HCT) 40-25 MG tablet Take 1 tablet by mouth daily.    Marland Kitchen triamcinolone cream (KENALOG) 0.1 % Apply 1 application topically daily as needed. 453 g 3   No current  facility-administered medications on file prior to visit.    No Known Allergies  Family History  Problem Relation Age of Onset  . Stroke Mother   . Stroke Father     Social History   Socioeconomic History  . Marital status: Married    Spouse name: Not on file  . Number of children: 2  . Years of education: Not on file  . Highest education level: Not on file  Occupational History  . Occupation: Unemployed  Tobacco Use  . Smoking status: Never Smoker  . Smokeless tobacco: Never Used  Substance and Sexual Activity  . Alcohol use: Never  . Drug use: Never  . Sexual activity: Yes  Other Topics Concern  . Not on file  Social History Narrative  . Not on file   Social Determinants of Health   Financial Resource Strain:   . Difficulty of Paying Living Expenses: Not on file  Food Insecurity:   . Worried About Charity fundraiser in the Last Year: Not on file  . Ran Out of Food in the Last Year: Not on file  Transportation Needs:   . Lack of Transportation (Medical): Not on file  . Lack of Transportation (Non-Medical): Not on file  Physical Activity:   . Days of Exercise per Week: Not on file  . Minutes of Exercise per Session: Not on file  Stress:   . Feeling of Stress :  Not on file  Social Connections:   . Frequency of Communication with Friends and Family: Not on file  . Frequency of Social Gatherings with Friends and Family: Not on file  . Attends Religious Services: Not on file  . Active Member of Clubs or Organizations: Not on file  . Attends Archivist Meetings: Not on file  . Marital Status: Not on file   Review of Systems - See HPI.  All other ROS are negative.  Ht 5' 6.5" (1.689 m)   Wt 194 lb (88 kg)   BMI 30.84 kg/m   Physical Exam Vitals reviewed.  Constitutional:      Appearance: Normal appearance.  HENT:     Head: Normocephalic and atraumatic.     Mouth/Throat:     Mouth: Mucous membranes are moist. No lacerations or oral lesions.      Dentition: Normal dentition.     Tongue: No lesions.     Palate: No mass and lesions.     Pharynx: Oropharynx is clear.  Cardiovascular:     Rate and Rhythm: Normal rate and regular rhythm.     Pulses: Normal pulses.     Heart sounds: Normal heart sounds.  Pulmonary:     Effort: Pulmonary effort is normal.  Musculoskeletal:     Cervical back: Neck supple.  Neurological:     Mental Status: He is alert.     Recent Results (from the past 2160 hour(s))  CBC with Differential     Status: None   Collection Time: 04/13/20 10:38 AM  Result Value Ref Range   WBC 5.5 4.0 - 10.5 K/uL   RBC 4.77 4.22 - 5.81 MIL/uL   Hemoglobin 13.7 13.0 - 17.0 g/dL   HCT 41.4 39 - 52 %   MCV 86.8 80.0 - 100.0 fL   MCH 28.7 26.0 - 34.0 pg   MCHC 33.1 30.0 - 36.0 g/dL   RDW 13.0 11.5 - 15.5 %   Platelets 175 150 - 400 K/uL   nRBC 0.0 0.0 - 0.2 %   Neutrophils Relative % 61 %   Neutro Abs 3.3 1.7 - 7.7 K/uL   Lymphocytes Relative 23 %   Lymphs Abs 1.3 0.7 - 4.0 K/uL   Monocytes Relative 8 %   Monocytes Absolute 0.5 0.1 - 1.0 K/uL   Eosinophils Relative 7 %   Eosinophils Absolute 0.4 0.0 - 0.5 K/uL   Basophils Relative 1 %   Basophils Absolute 0.0 0.0 - 0.1 K/uL   Immature Granulocytes 0 %   Abs Immature Granulocytes 0.02 0.00 - 0.07 K/uL    Comment: Performed at [facility], [address], [place], [place] [address]  Comprehensive metabolic panel     Status: Abnormal   Collection Time: 04/13/20 10:38 AM  Result Value Ref Range   Sodium 136 135 - 145 mmol/L   Potassium 3.3 (L) 3.5 - 5.1 mmol/L   Chloride 97 (L) 98 - 111 mmol/L   CO2 30 22 - 32 mmol/L   Glucose, Bld 104 (H) 70 - 99 mg/dL    Comment: Glucose reference range applies only to samples taken after fasting for at least 8 hours.   BUN 14 8 - 23 mg/dL   Creatinine, Ser 1.13 0.61 - 1.24 mg/dL   Calcium 8.8 (L) 8.9 - 10.3 mg/dL   Total Protein 7.7 6.5 - 8.1 g/dL   Albumin 4.0 3.5 - 5.0 g/dL   AST 24 15 - 41 U/L   ALT 23 0 -  44 U/L     Alkaline Phosphatase 56 38 - 126 U/L   Total Bilirubin 1.0 0.3 - 1.2 mg/dL   GFR, Estimated >60 >60 mL/min   Anion gap 9 5 - 15    Comment: Performed at [facility], [address], [place], [place] [address]  Protein electrophoresis, serum     Status: Abnormal   Collection Time: 04/13/20 10:38 AM  Result Value Ref Range   Total Protein ELP 7.6 6.0 - 8.5 g/dL   Albumin ELP 3.9 2.9 - 4.4 g/dL   Alpha-1-Globulin 0.2 0.0 - 0.4 g/dL   Alpha-2-Globulin 0.6 0.4 - 1.0 g/dL   Beta Globulin 0.8 0.7 - 1.3 g/dL   Gamma Globulin 2.1 (H) 0.4 - 1.8 g/dL   M-Spike, % 2.0 (H) Not Observed g/dL   SPE Interp. Comment     Comment: (NOTE) The SPE pattern demonstrates a single peak (M-spike) in the gamma region which may represent monoclonal protein. This peak may also be caused by circulating immune complexes, cryoglobulins, C-reactive protein, fibrinogen or hemolysis.  If clinically indicated, the presence of a monoclonal gammopathy may be confirmed by immuno- fixation, as well as an evaluation of the urine for the presence of Bence-Jones protein. Performed At: [facility] [place], [place] [id number] Rush Farmer MD [id number]    Comment Comment     Comment: (NOTE) Protein electrophoresis scan will follow via computer, mail, or courier delivery.    Globulin, Total 3.7 2.2 - 3.9 g/dL   A/G Ratio 1.1 0.7 - 1.7  Kappa/lambda light chains     Status: Abnormal   Collection Time: 04/13/20 10:38 AM  Result Value Ref Range   Kappa free light chain 8.9 3.3 - 19.4 mg/L   Lamda free light chains 269.0 (H) 5.7 - 26.3 mg/L   Kappa, lamda light chain ratio 0.03 (L) 0.26 - 1.65    Comment: (NOTE) Performed At: [facility] [place], [place] [id number] Rush Farmer MD MK:[id number]   Lactate dehydrogenase     Status: None   Collection Time: 04/13/20 10:38 AM  Result Value Ref Range   LDH 157 98 - 192 U/L    Comment: Performed at [facility], [address] [place], [place] [address]    Assessment/Plan: 1. Coronary artery disease involving native coronary artery of native heart without angina pectoris Continue current medication regimen. Medications refilled. Referral to new Cardiologist placed for patient to have continued follow-up.   2. Mixed hyperlipidemia Taking medications as directed. Dietary and exercise recommendations reviewed with patient. Medications refilled.   3. Monoclonal gammopathy Continue management per Oncology.   4. Mouth pain Mild irritation noted without lesion. Supportive measures discussed including soft-bristled tooth brush, night guard, etc. Rx magic Mouthwash. Follow-up if not resolving.    This visit occurred during the SARS-CoV-2 public health emergency.  Safety protocols were in place, including screening questions prior to the visit, additional usage of staff PPE, and extensive cleaning of exam room while observing appropriate contact time as indicated for disinfecting solutions.     Leeanne Rio, PA-C

## 2020-04-21 NOTE — Patient Instructions (Signed)
Please use the prescription mouthwash as directed. If symptoms are not improving we will need reassessment.   Follow-up with specialists as scheduled.  You will be contacted by a new Cardiology office for appointment.   Medications have been refilled for you.   It was very nice meeting you today. Welcome to [facility]!

## 2020-04-29 NOTE — Progress Notes (Signed)
   New Patient   Subjective  Walter Perez is a 64 y.o. male who presents for the following: Skin Problem (Check spot right temple area. Also patient has keloid from heart surgery. ) and Medication Refill (Triamcinolone refill Walter Perez gave to patient for skin in winter. ).  Follow up Location:  Duration:  Quality:  Associated Signs/Symptoms: Modifying Factors:  Severity:  Timing: Context:    The following portions of the chart were reviewed this encounter and updated as appropriate: Tobacco  Allergies  Meds  Problems  Med Hx  Surg Hx  Fam Hx      Objective  Well appearing patient in no apparent distress; mood and affect are within normal limits.  All skin waist up examined.      Assessment & Plan  Neoplasm of uncertain behavior of skin Right Temporal Scalp  Skin / nail biopsy Type of biopsy: tangential   Informed consent: discussed and consent obtained   Timeout: patient name, date of birth, surgical site, and procedure verified   Procedure prep:  Patient was prepped and draped in usual sterile fashion (Non sterile) Prep type:  Chlorhexidine Anesthesia: the lesion was anesthetized in a standard fashion   Anesthetic:  1% lidocaine w/ epinephrine 1-100,000 local infiltration Instrument used: flexible razor blade   Outcome: patient tolerated procedure well   Post-procedure details: wound care instructions given    Specimen 1 - Surgical pathology Differential Diagnosis: scc vs bcc Check Margins: No  Dermatitis (2) Left Thigh - Anterior; Right Thigh - Anterior  May continue triamcinolone cream on a as needed basis.  This is applied daily after bathing but should not be applied on body folds or face.  When doing well, taper the use.  triamcinolone cream (KENALOG) 0.1 % - Left Thigh - Anterior, Right Thigh - Anterior  Keloid Chest - Medial (Center)  Intralesional injection - Chest - Medial [facility]) Location: Chest  Informed Consent: Discussed risks  (infection, pain, bleeding, bruising, thinning of the skin, loss of skin pigment,  Indentation, lack of resolution, and recurrence of lesion) and benefits of the procedure, as well as the alternatives. Informed consent was obtained. Preparation: The area was prepared in a standard fashion.   Procedure Details: An intralesional injection was performed with Kenalog 40 mg/cc. 0.1 cc in total were injected.  Total number of injections: 5  Plan: The patient was instructed on post-op care. Recommend OTC analgesia as needed for pain.

## 2020-05-07 ENCOUNTER — Encounter: Payer: Self-pay | Admitting: Dermatology

## 2020-06-09 ENCOUNTER — Other Ambulatory Visit: Payer: Self-pay

## 2020-06-09 ENCOUNTER — Ambulatory Visit (INDEPENDENT_AMBULATORY_CARE_PROVIDER_SITE_OTHER): Payer: 59 | Admitting: Dermatology

## 2020-06-09 ENCOUNTER — Encounter: Payer: Self-pay | Admitting: Dermatology

## 2020-06-09 DIAGNOSIS — L910 Hypertrophic scar: Secondary | ICD-10-CM

## 2020-06-09 MED ORDER — TRIAMCINOLONE ACETONIDE 40 MG/ML IJ SUSP
40.0000 mg | Freq: Once | INTRAMUSCULAR | Status: AC
  Administered 2020-06-09: 40 mg

## 2020-06-14 ENCOUNTER — Encounter: Payer: Self-pay | Admitting: Dermatology

## 2020-06-14 MED ORDER — TRIAMCINOLONE ACETONIDE 40 MG/ML IJ SUSP: 40.0000 mg | Freq: Once | INTRAMUSCULAR | Status: AC

## 2020-06-14 NOTE — Progress Notes (Signed)
° °  Follow-Up Visit   Subjective  Walter Perez is a 64 y.o. male who presents for the following: Keloid (here for steroid injection- "helped").  Keloids Location: Chest and abdomen duration:  Quality: Improved Associated Signs/Symptoms: Modifying Factors: Intralesional triamcinolone Severity:  Timing: Context:   Objective  Well appearing patient in no apparent distress; mood and affect are within normal limits. Objective  Chest - Medial [facility]): Keloid perhaps 50% flatten, less symptomatic.  Patient would like reinjection.   All skin waist up examined.   Assessment & Plan    Keloid Chest - Medial (Center)  Injected with 40 mg/cc triamcinolone 8 total injections, 0.2 cc  triamcinolone acetonide (KENALOG-40) injection 40 mg - Chest - Medial (Center)     I, Lavonna Monarch, MD, have reviewed all documentation for this visit.  The documentation on 06/14/20 for the exam, diagnosis, procedures, and orders are all accurate and complete.

## 2020-06-24 ENCOUNTER — Encounter: Payer: Self-pay | Admitting: Physician Assistant

## 2020-06-24 DIAGNOSIS — I2510 Atherosclerotic heart disease of native coronary artery without angina pectoris: Secondary | ICD-10-CM

## 2020-06-24 DIAGNOSIS — Z951 Presence of aortocoronary bypass graft: Secondary | ICD-10-CM

## 2020-06-27 NOTE — Telephone Encounter (Signed)
Placed cardiac referral for patient. Thank you for helping to set this up.

## 2020-07-19 ENCOUNTER — Ambulatory Visit: Payer: 59 | Admitting: Internal Medicine

## 2020-08-06 ENCOUNTER — Ambulatory Visit (INDEPENDENT_AMBULATORY_CARE_PROVIDER_SITE_OTHER): Payer: 59 | Admitting: Internal Medicine

## 2020-08-06 ENCOUNTER — Ambulatory Visit: Payer: 59 | Admitting: Internal Medicine

## 2020-08-06 ENCOUNTER — Other Ambulatory Visit: Payer: Self-pay

## 2020-08-06 ENCOUNTER — Encounter: Payer: Self-pay | Admitting: Internal Medicine

## 2020-08-06 VITALS — BP 124/74 | HR 82 | Ht 67.0 in | Wt 201.0 lb

## 2020-08-06 DIAGNOSIS — I2510 Atherosclerotic heart disease of native coronary artery without angina pectoris: Secondary | ICD-10-CM | POA: Diagnosis not present

## 2020-08-06 DIAGNOSIS — I10 Essential (primary) hypertension: Secondary | ICD-10-CM | POA: Diagnosis not present

## 2020-08-06 DIAGNOSIS — E782 Mixed hyperlipidemia: Secondary | ICD-10-CM | POA: Diagnosis not present

## 2020-08-06 MED ORDER — METOPROLOL SUCCINATE ER 100 MG PO TB24: 100.0000 mg | ORAL_TABLET | Freq: Every day | ORAL | 3 refills | Status: DC

## 2020-08-06 NOTE — Patient Instructions (Signed)
Medication Instructions:  Your physician has recommended you make the following change in your medication:  1) STOP taking metoprolol tartrate 2) START taking Toprol XL (metoprolol succinate) 100 mg daily  *If you need a refill on your cardiac medications before your next appointment, please call your pharmacy*   Lab Work: TODAY: CBC, BMET, FLP, HgbA1C If you have labs (blood work) drawn today and your tests are completely normal, you will receive your results only by: Marland Kitchen MyChart Message (if you have MyChart) OR . A paper copy in the mail If you have any lab test that is abnormal or we need to change your treatment, we will call you to review the results.   Follow-Up: At [facility], you and your health needs are our priority.  As part of our continuing mission to provide you with exceptional heart care, we have created designated Provider Care Teams.  These Care Teams include your primary Cardiologist (physician) and Advanced Practice Providers (APPs -  Physician Assistants and Nurse Practitioners) who all work together to provide you with the care you need, when you need it.  Your next appointment:   6 month(s)  The format for your next appointment:   In Person  Provider:   You may see Dorris Carnes, MD or one of the following Advanced Practice Providers on your designated Care Team:    Richardson Dopp, PA-C  [place], [place]

## 2020-08-06 NOTE — Progress Notes (Signed)
Cardiology Office Note   Date:  08/06/2020   ID:  QUINTIN HJORT, DOB [birth year], MRN [id number]  PCP:  Brunetta Jeans, PA-C  Cardiologist:   Dorris Carnes, MD   Pt presents to establish for f/u of CAD Previously followed at [facility]    History of Present Illness: Walter Perez is a 65 y.o. male with a history of CAD :Myovjue in Nov 2020 showed anteroapical ischemia   Cath showed 3 V CAD; On 06/19/19 he underwent CABG x 4 (SVG to  PDA and distal lCx; RIMA to LAD and OM1 as Y graft from LIMA.  PDA endarterectomy)  Post op he had some Afib  Finished 30 days of AMiodarone     The pt also hsa a hx of HL, HTN, MBGUS    Patient says before surgery had bilateral arm pain with walking  Never had CP Since CABG he has not had any discomfort    He says his breathing is OK   No dizzienss     Diet:   Breakfast   Noey coated Cheerios; fruit Lunch:   Sandwich Dinner:  CHeck  Veg 1/2 coke with unsweet tea or gatorade  Current Meds  Medication Sig  . aspirin 81 MG EC tablet Take 81 mg by mouth daily.   Marland Kitchen atorvastatin (LIPITOR) 80 MG tablet Take 1 tablet (80 mg total) by mouth daily at 6 PM.  . clopidogrel (PLAVIX) 75 MG tablet Take 1 tablet (75 mg total) by mouth daily.  . metoprolol tartrate (LOPRESSOR) 25 MG tablet Pt takes 1 1/2 tablets every 8 hours  . nitroGLYCERIN (NITROSTAT) 0.4 MG SL tablet   . olmesartan-hydrochlorothiazide (BENICAR HCT) 40-25 MG tablet Take 1 tablet by mouth daily.  Marland Kitchen triamcinolone cream (KENALOG) 0.1 % Apply 1 application topically daily as needed.   Current Facility-Administered Medications for the 08/06/20 encounter (Office Visit) with Fay Records, MD  Medication  . triamcinolone acetonide (KENALOG-40) injection 40 mg     Allergies:   Patient has no known allergies.   Past Medical History:  Diagnosis Date  . Coronary artery disease   . Hyperlipidemia   . Hypertension   . Myocardial infarction ([place]) 06/19/2019    Past Surgical History:  Procedure Laterality  Date  . CORONARY ARTERY BYPASS GRAFT  06/19/2019   quadruple     Social History:  The patient  reports that he has never smoked. He has never used smokeless tobacco. He reports that he does not drink alcohol and does not use drugs.   Family History:  The patient's family history includes Alzheimer's disease in his sister; Kidney disease in his brother; Stroke in his father and mother.    ROS:  Please see the history of present illness. All other systems are reviewed and  Negative to the above problem except as noted.    PHYSICAL EXAM: VS:  BP 124/74   Pulse 82   Ht 5\' 7"  (1.702 m)   Wt 201 lb (91.2 kg)   SpO2 98%   BMI 31.48 kg/m   GEN: OBese 65 yo n no acute distress  HEENT: normal  Neck: no JVD, carotid bruits, Cardiac: RRR; no murmurs,   NO LE  edema  Respiratory:  clear to auscultation bilaterally,  GI: soft, nontender, nondistended, + BS  No hepatomegaly  MS: no deformity Moving all extremities   Skin: warm and dry, no rash Neuro:  Strength and sensation are intact Psych: euthymic mood, full affect   EKG:  EKG is ordered today.  SR 82 bpm  Nonspecific ST changes     Lipid Panel No results found for: CHOL, TRIG, HDL, CHOLHDL, VLDL, LDLCALC, LDLDIRECT    Wt Readings from Last 3 Encounters:  08/06/20 201 lb (91.2 kg)  04/21/20 194 lb (88 kg)  04/20/20 193 lb (87.5 kg)      ASSESSMENT AND PLAN:   1  CAD Pt s/p CABG ins 2020 at [facility]  Has done well since   Follow   Will review records further   May stop Plavix    2 HL   Check lipids    3  HTN   BP controlled   Switch to long acting Toprol XL     4  Diet  Discussed sugar intake   PLan for f/u in the fall.     Current medicines are reviewed at length with the patient today.  The patient does not have concerns regarding medicines.  Signed, Dorris Carnes, MD  08/06/2020 3:15 PM    [facility] [place], [place], [place]  [address] Phone: [phone]; Fax: [phone]

## 2020-08-07 LAB — BASIC METABOLIC PANEL
BUN/Creatinine Ratio: 14 (ref 10–24)
BUN: 19 mg/dL (ref 8–27)
CO2: 29 mmol/L (ref 20–29)
Calcium: 9.4 mg/dL (ref 8.6–10.2)
Chloride: 99 mmol/L (ref 96–106)
Creatinine, Ser: 1.34 mg/dL — ABNORMAL HIGH (ref 0.76–1.27)
GFR calc Af Amer: 65 mL/min/{1.73_m2} (ref >59)
GFR calc non Af Amer: 56 mL/min/{1.73_m2} — ABNORMAL LOW (ref >59)
Glucose: 111 mg/dL — ABNORMAL HIGH (ref 65–99)
Potassium: 3.3 mmol/L — ABNORMAL LOW (ref 3.5–5.2)
Sodium: 141 mmol/L (ref 134–144)

## 2020-08-07 LAB — CBC
Hematocrit: 42 % (ref 37.5–51.0)
Hemoglobin: 14.4 g/dL (ref 13.0–17.7)
MCH: 29.6 pg (ref 26.6–33.0)
MCHC: 34.3 g/dL (ref 31.5–35.7)
MCV: 86 fL (ref 79–97)
Platelets: 187 10*3/uL (ref 150–450)
RBC: 4.87 x10E6/uL (ref 4.14–5.80)
RDW: 13.1 % (ref 11.6–15.4)
WBC: 7.1 10*3/uL (ref 3.4–10.8)

## 2020-08-07 LAB — LIPID PANEL
Chol/HDL Ratio: 3.3 ratio (ref 0.0–5.0)
Cholesterol, Total: 131 mg/dL (ref 100–199)
HDL: 40 mg/dL (ref >39)
LDL Chol Calc (NIH): 66 mg/dL (ref 0–99)
Triglycerides: 140 mg/dL (ref 0–149)
VLDL Cholesterol Cal: 25 mg/dL (ref 5–40)

## 2020-08-07 LAB — HEMOGLOBIN A1C
Est. average glucose Bld gHb Est-mCnc: 123 mg/dL
Hgb A1c MFr Bld: 5.9 % — ABNORMAL HIGH (ref 4.8–5.6)

## 2020-08-18 ENCOUNTER — Inpatient Hospital Stay (HOSPITAL_COMMUNITY): Payer: 59

## 2020-08-18 ENCOUNTER — Inpatient Hospital Stay (HOSPITAL_COMMUNITY): Payer: 59 | Attending: Hematology

## 2020-08-18 ENCOUNTER — Ambulatory Visit (HOSPITAL_COMMUNITY): Payer: 59 | Admitting: Hematology

## 2020-08-18 ENCOUNTER — Ambulatory Visit (HOSPITAL_COMMUNITY)
Admission: RE | Admit: 2020-08-18 | Discharge: 2020-08-18 | Disposition: A | Discharge status: Home / Self Care | Payer: 59 | Source: Ambulatory Visit | Attending: Hematology | Admitting: Hematology

## 2020-08-18 ENCOUNTER — Other Ambulatory Visit: Payer: Self-pay

## 2020-08-18 DIAGNOSIS — D472 Monoclonal gammopathy: Secondary | ICD-10-CM

## 2020-08-18 DIAGNOSIS — Z7982 Long term (current) use of aspirin: Secondary | ICD-10-CM | POA: Insufficient documentation

## 2020-08-18 DIAGNOSIS — E785 Hyperlipidemia, unspecified: Secondary | ICD-10-CM | POA: Diagnosis not present

## 2020-08-18 DIAGNOSIS — Z79899 Other long term (current) drug therapy: Secondary | ICD-10-CM | POA: Insufficient documentation

## 2020-08-18 DIAGNOSIS — C9000 Multiple myeloma not having achieved remission: Secondary | ICD-10-CM | POA: Insufficient documentation

## 2020-08-18 DIAGNOSIS — I252 Old myocardial infarction: Secondary | ICD-10-CM | POA: Insufficient documentation

## 2020-08-18 DIAGNOSIS — I10 Essential (primary) hypertension: Secondary | ICD-10-CM | POA: Diagnosis not present

## 2020-08-18 DIAGNOSIS — I2510 Atherosclerotic heart disease of native coronary artery without angina pectoris: Secondary | ICD-10-CM | POA: Diagnosis not present

## 2020-08-18 LAB — CBC WITH DIFFERENTIAL/PLATELET
Abs Immature Granulocytes: 0.02 10*3/uL (ref 0.00–0.07)
Basophils Absolute: 0 10*3/uL (ref 0.0–0.1)
Basophils Relative: 1 %
Eosinophils Absolute: 0.3 10*3/uL (ref 0.0–0.5)
Eosinophils Relative: 6 %
HCT: 45 % (ref 39.0–52.0)
Hemoglobin: 14.7 g/dL (ref 13.0–17.0)
Immature Granulocytes: 0 %
Lymphocytes Relative: 16 %
Lymphs Abs: 0.9 10*3/uL (ref 0.7–4.0)
MCH: 28.9 pg (ref 26.0–34.0)
MCHC: 32.7 g/dL (ref 30.0–36.0)
MCV: 88.6 fL (ref 80.0–100.0)
Monocytes Absolute: 0.4 10*3/uL (ref 0.1–1.0)
Monocytes Relative: 7 %
Neutro Abs: 3.9 10*3/uL (ref 1.7–7.7)
Neutrophils Relative %: 70 %
Platelets: 186 10*3/uL (ref 150–400)
RBC: 5.08 MIL/uL (ref 4.22–5.81)
RDW: 12.6 % (ref 11.5–15.5)
WBC: 5.6 10*3/uL (ref 4.0–10.5)
nRBC: 0 % (ref 0.0–0.2)

## 2020-08-18 LAB — COMPREHENSIVE METABOLIC PANEL
ALT: 34 U/L (ref 0–44)
AST: 27 U/L (ref 15–41)
Albumin: 4.1 g/dL (ref 3.5–5.0)
Alkaline Phosphatase: 60 U/L (ref 38–126)
Anion gap: 8 (ref 5–15)
BUN: 17 mg/dL (ref 8–23)
CO2: 30 mmol/L (ref 22–32)
Calcium: 9 mg/dL (ref 8.9–10.3)
Chloride: 97 mmol/L — ABNORMAL LOW (ref 98–111)
Creatinine, Ser: 1.16 mg/dL (ref 0.61–1.24)
GFR, Estimated: >60 mL/min (ref >60)
Glucose, Bld: 173 mg/dL — ABNORMAL HIGH (ref 70–99)
Potassium: 3.3 mmol/L — ABNORMAL LOW (ref 3.5–5.1)
Sodium: 135 mmol/L (ref 135–145)
Total Bilirubin: 0.9 mg/dL (ref 0.3–1.2)
Total Protein: 7.7 g/dL (ref 6.5–8.1)

## 2020-08-18 LAB — LACTATE DEHYDROGENASE: LDH: 166 U/L (ref 98–192)

## 2020-08-19 LAB — KAPPA/LAMBDA LIGHT CHAINS
Kappa free light chain: 9.9 mg/L (ref 3.3–19.4)
Kappa, lambda light chain ratio: 0.04 — ABNORMAL LOW (ref 0.26–1.65)
Lambda free light chains: 275.1 mg/L — ABNORMAL HIGH (ref 5.7–26.3)

## 2020-08-23 LAB — PROTEIN ELECTROPHORESIS, SERUM
A/G Ratio: 1.1 (ref 0.7–1.7)
Albumin ELP: 4.1 g/dL (ref 2.9–4.4)
Alpha-1-Globulin: 0.2 g/dL (ref 0.0–0.4)
Alpha-2-Globulin: 0.5 g/dL (ref 0.4–1.0)
Beta Globulin: 0.9 g/dL (ref 0.7–1.3)
Gamma Globulin: 2 g/dL — ABNORMAL HIGH (ref 0.4–1.8)
Globulin, Total: 3.6 g/dL (ref 2.2–3.9)
M-Spike, %: 1.8 g/dL — ABNORMAL HIGH
Total Protein ELP: 7.7 g/dL (ref 6.0–8.5)

## 2020-08-25 ENCOUNTER — Inpatient Hospital Stay (HOSPITAL_BASED_OUTPATIENT_CLINIC_OR_DEPARTMENT_OTHER): Payer: 59 | Admitting: Hematology

## 2020-08-25 ENCOUNTER — Other Ambulatory Visit: Payer: Self-pay

## 2020-08-25 VITALS — BP 136/83 | HR 77 | Temp 96.9°F | Resp 18 | Wt 201.1 lb

## 2020-08-25 DIAGNOSIS — C9000 Multiple myeloma not having achieved remission: Secondary | ICD-10-CM | POA: Diagnosis not present

## 2020-08-25 DIAGNOSIS — D472 Monoclonal gammopathy: Secondary | ICD-10-CM

## 2020-08-25 NOTE — Patient Instructions (Signed)
[place] at [facility] Discharge Instructions  You were seen today by Dr. Delton Coombes. He went over your recent results. Eat a variety of fruits to keep your potassium levels up. Dr. Delton Coombes will see you back in 6 months for labs and follow up.   Thank you for choosing [place] at [facility] to provide your oncology and hematology care.  To afford each patient quality time with our provider, please arrive at least 15 minutes before your scheduled appointment time.   If you have a lab appointment with the [facility] please come in thru the Main Entrance and check in at the main information desk  You need to re-schedule your appointment should you arrive 10 or more minutes late.  We strive to give you quality time with our providers, and arriving late affects you and other patients whose appointments are after yours.  Also, if you no show three or more times for appointments you may be dismissed from the clinic at the providers discretion.     Again, thank you for choosing [facility].  Our hope is that these requests will decrease the amount of time that you wait before being seen by our physicians.       _____________________________________________________________  Should you have questions after your visit to [facility], please contact our office at [phone] between the hours of 8:00 a.m. and 4:30 p.m.  Voicemails left after 4:00 p.m. will not be returned until the following business day.  For prescription refill requests, have your pharmacy contact our office and allow 72 hours.    Cancer Center Support Programs:   > Cancer Support Group  2nd Tuesday of the month 1pm-2pm, Journey Room

## 2020-08-25 NOTE — Progress Notes (Signed)
Walter Perez, [place] [address]   CLINIC:  Medical Oncology/Hematology  PCP:  Brunetta Jeans, PA-C [address] [place] [address]  [phone]  REASON FOR VISIT:  Follow-up for smoldering myeloma  PRIOR THERAPY: None  CURRENT THERAPY: Observation  INTERVAL HISTORY:  Mr. Walter Perez, a 65 y.o. male, returns for routine follow-up for his smoldering myeloma. Walter Perez was last seen on 04/20/2020.  Today he reports feeling well. He denies having any recent infections, F/C, night sweats, ER visits, leg swelling or new pains. Dr. Harrington Challenger, his cardiologist, changed him from metoprolol TID to Toprol XL 100 mg. His appetite is excellent and denies having N/V/D.   REVIEW OF SYSTEMS:  Review of Systems  Constitutional: Positive for fatigue (75%). Negative for appetite change, chills, diaphoresis and fever.  Cardiovascular: Negative for leg swelling.  Gastrointestinal: Negative for diarrhea, nausea and vomiting.  Musculoskeletal: Negative for arthralgias and myalgias.  All other systems reviewed and are negative.   PAST MEDICAL/SURGICAL HISTORY:  Past Medical History:  Diagnosis Date  . Coronary artery disease   . Hyperlipidemia   . Hypertension   . Myocardial infarction ([place]) 06/19/2019   Past Surgical History:  Procedure Laterality Date  . CORONARY ARTERY BYPASS GRAFT  06/19/2019   quadruple    SOCIAL HISTORY:  Social History   Socioeconomic History  . Marital status: Married    Spouse name: Not on file  . Number of children: 2  . Years of education: Not on file  . Highest education level: Not on file  Occupational History  . Occupation: Unemployed  Tobacco Use  . Smoking status: Never Smoker  . Smokeless tobacco: Never Used  Substance and Sexual Activity  . Alcohol use: Never  . Drug use: Never  . Sexual activity: Yes  Other Topics Concern  . Not on file  Social History Narrative  . Not on file   Social  Determinants of Health   Financial Resource Strain: Not on file  Food Insecurity: Not on file  Transportation Needs: Not on file  Physical Activity: Not on file  Stress: Not on file  Social Connections: Not on file  Intimate Partner Violence: Not At Risk  . Fear of Current or Ex-Partner: No  . Emotionally Abused: No  . Physically Abused: No  . Sexually Abused: No    FAMILY HISTORY:  Family History  Problem Relation Age of Onset  . Stroke Mother   . Stroke Father   . Kidney disease Brother   . Alzheimer's disease Sister     CURRENT MEDICATIONS:  Current Outpatient Medications  Medication Sig Dispense Refill  . aspirin 81 MG EC tablet Take 81 mg by mouth daily.     Marland Kitchen atorvastatin (LIPITOR) 80 MG tablet Take 1 tablet (80 mg total) by mouth daily at 6 PM. 90 tablet 1  . clopidogrel (PLAVIX) 75 MG tablet Take 1 tablet (75 mg total) by mouth daily. 90 tablet 1  . metoprolol succinate (TOPROL-XL) 100 MG 24 hr tablet Take 1 tablet (100 mg total) by mouth daily. Take with or immediately following a meal. 90 tablet 3  . nitroGLYCERIN (NITROSTAT) 0.4 MG SL tablet     . olmesartan-hydrochlorothiazide (BENICAR HCT) 40-25 MG tablet Take 1 tablet by mouth daily. 90 tablet 1  . triamcinolone cream (KENALOG) 0.1 % Apply 1 application topically daily as needed. 453 g 3   Current Facility-Administered Medications  Medication Dose Route Frequency  Provider Last Rate Last Admin  . triamcinolone acetonide (KENALOG-40) injection 40 mg  40 mg Intramuscular Once Lavonna Monarch, MD        ALLERGIES:  No Known Allergies  PHYSICAL EXAM:  Performance status (ECOG): 0 - Asymptomatic  Vitals:   08/25/20 1439  BP: 136/83  Pulse: 77  Resp: 18  Temp: (!) 96.9 F (36.1 C)  SpO2: 98%   Wt Readings from Last 3 Encounters:  08/25/20 201 lb 1.6 oz (91.2 kg)  08/06/20 201 lb (91.2 kg)  04/21/20 194 lb (88 kg)   Physical Exam Vitals reviewed.  Constitutional:      Appearance: Normal appearance.  He is obese.  Cardiovascular:     Rate and Rhythm: Normal rate and regular rhythm.     Pulses: Normal pulses.     Heart sounds: Normal heart sounds.  Pulmonary:     Effort: Pulmonary effort is normal.     Breath sounds: Normal breath sounds.  Abdominal:     Palpations: Abdomen is soft. There is no hepatomegaly, splenomegaly or mass.     Tenderness: There is no abdominal tenderness.     Hernia: No hernia is present.  Musculoskeletal:     Thoracic back: No tenderness or bony tenderness.     Right lower leg: No edema.     Left lower leg: No edema.  Neurological:     General: No focal deficit present.     Mental Status: He is alert and oriented to person, place, and time.  Psychiatric:        Mood and Affect: Mood normal.        Behavior: Behavior normal.     LABORATORY DATA:  I have reviewed the labs as listed.  CBC Latest Ref Rng & Units 08/18/2020 08/06/2020 04/13/2020  WBC 4.0 - 10.5 K/uL 5.6 7.1 5.5  Hemoglobin 13.0 - 17.0 g/dL 14.7 14.4 13.7  Hematocrit 39.0 - 52.0 % 45.0 42.0 41.4  Platelets 150 - 400 K/uL 186 187 175   CMP Latest Ref Rng & Units 08/18/2020 08/06/2020 04/13/2020  Glucose 70 - 99 mg/dL 173(H) 111(H) 104(H)  BUN 8 - 23 mg/dL 17 19 14   Creatinine 0.61 - 1.24 mg/dL 1.16 1.34(H) 1.13  Sodium 135 - 145 mmol/L 135 141 136  Potassium 3.5 - 5.1 mmol/L 3.3(L) 3.3(L) 3.3(L)  Chloride 98 - 111 mmol/L 97(L) 99 97(L)  CO2 22 - 32 mmol/L 30 29 30   Calcium 8.9 - 10.3 mg/dL 9.0 9.4 8.8(L)  Total Protein 6.5 - 8.1 g/dL 7.7 - 7.7  Total Bilirubin 0.3 - 1.2 mg/dL 0.9 - 1.0  Alkaline Phos 38 - 126 U/L 60 - 56  AST 15 - 41 U/L 27 - 24  ALT 0 - 44 U/L 34 - 23      Component Value Date/Time   RBC 5.08 08/18/2020 0849   MCV 88.6 08/18/2020 0849   MCV 86 08/06/2020 1607   MCH 28.9 08/18/2020 0849   MCHC 32.7 08/18/2020 0849   RDW 12.6 08/18/2020 0849   RDW 13.1 08/06/2020 1607   LYMPHSABS 0.9 08/18/2020 0849   MONOABS 0.4 08/18/2020 0849   EOSABS 0.3 08/18/2020 0849    BASOSABS 0.0 08/18/2020 0849   Lab Results  Component Value Date   LDH 166 08/18/2020   LDH 157 04/13/2020   LDH 188 12/08/2019   Lab Results  Component Value Date   TOTALPROTELP 7.7 08/18/2020   ALBUMINELP 4.1 08/18/2020   A1GS 0.2 08/18/2020   A2GS 0.5  08/18/2020   BETS 0.9 08/18/2020   GAMS 2.0 (H) 08/18/2020   MSPIKE 1.8 (H) 08/18/2020   SPEI Comment 08/18/2020    Lab Results  Component Value Date   KPAFRELGTCHN 9.9 08/18/2020   LAMBDASER 275.1 (H) 08/18/2020   KAPLAMBRATIO 0.04 (L) 08/18/2020    DIAGNOSTIC IMAGING:  I have independently reviewed the scans and discussed with the patient. DG Bone Survey Met  Result Date: 08/19/2020 CLINICAL DATA:  Multiple myeloma EXAM: METASTATIC BONE SURVEY COMPARISON:  May 09, 2019 FINDINGS: Skull: No blastic or lytic bone lesions.  Sella appears normal. Cervical spine: No blastic or lytic bone lesions. Mild disc space narrowing at C6-7. Thoracic spine: No appreciable blastic or lytic bone lesions. Chest: Lungs are clear. Heart size normal. No adenopathy. Status post median sternotomy. No blastic or lytic bone lesions. Lumbar spine: No appreciable blastic or lytic bone lesions. Pelvis: Sclerotic area in the lateral upper right sacrum is stable. No lytic or destructive bone lesions evident. Right femur: No blastic or lytic bone lesions. No abnormal periosteal reaction. Left femur: No blastic or lytic bone lesions. No abnormal periosteal reaction. Right tibia and fibula: No blastic or lytic bone lesions. No abnormal periosteal reaction. Left tibia and fibula: No blastic or lytic bone lesions. No abnormal periosteal reaction. Right shoulder and humerus. No blastic or lytic bone lesions. No abnormal periosteal reaction. Left shoulder and humerus: No blastic or lytic bone lesions. No abnormal periosteal reaction. 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 Right forearm: No blastic or lytic bone lesions. No abnormal periosteal  reaction. Left forearm: No blastic or lytic bone lesions. No abnormal periosteal reaction. IMPRESSION: Stable sclerotic focus in the superior right sacrum, likely of benign etiology. There are no lytic or destructive bone lesions. There are no findings which are felt to be indicative of multiple myeloma. Current study stable compared to prior examination from 2020. Electronically Signed   By: Lowella Grip III M.D.   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 Lambda light chains 303, ratio of 0.03. LDH normal. Beta-2 microglobulin 1.7. Serum viscosity normal. Hemoglobin, calcium and creatinine normal. -Bone marrow biopsy on 04/30/2019 showed atypical plasma cells representing 12% in the aspirate. Plasma cells display lambda light chain restriction. -Myeloma FISH panel shows gain of 1 q. 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 -24-hour urine negative for immunofixation and UPEP. Urine total protein was undetectable. -Skeletal survey on 05/09/2019 did not show any evidence of lytic lesions. Insurance refused PET CT scan. -MRI of the thoracic, lumbar spine and pelvis with and without contrast on 06/18/2019 did not show any evidence of myeloma. -Based on Mayo 2018 risk stratification system, his M spike is more than 2 g/dL. Involved/uninvolved free light chain ratio was more than 20. Bone marrow plasma cells were less than 20%. With 2 risk factors, he was considered high risk with estimated median time to progression of 29 months, estimated risk of progression of 24 %/year during first 2 years, 11 %/year for the next 3 years, 3 %/year for the next 5 years.  2. CAD: -Status post CABG at [facility] in December 2020.   PLAN:  1. 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  No B symptoms. -Reviewed myeloma labs from 08/18/2020.  Creatinine 1.16 and calcium  9.0. -M spike is stable at 1.8.  Hemoglobin normal at 14.7. -Lambda light  chains are 275 and stable.  Light chain ratio is 0.04 and stable. -Skeletal survey from 08/18/2020 reviewed by me did not show any lytic lesions. -He has been stable thus far without any progression.  Hence I will change his follow-up visits to every 6 months with repeat myeloma panel.  We will continue skeletal survey once a year.  Orders placed this encounter:  No orders of the defined types were placed in this encounter.    Walter Jack, MD [place] [phone]   I, Milinda Antis, am acting as a scribe for Dr. Sanda Linger.  I, Walter Jack MD, have reviewed the above documentation for accuracy and completeness, and I agree with the above.

## 2020-09-02 ENCOUNTER — Other Ambulatory Visit: Payer: Self-pay

## 2020-09-02 ENCOUNTER — Encounter: Payer: Self-pay | Admitting: Physician Assistant

## 2020-09-02 ENCOUNTER — Ambulatory Visit (INDEPENDENT_AMBULATORY_CARE_PROVIDER_SITE_OTHER): Payer: 59 | Admitting: Physician Assistant

## 2020-09-02 VITALS — BP 138/82 | HR 66 | Temp 98.0°F | Ht 67.0 in | Wt 199.0 lb

## 2020-09-02 DIAGNOSIS — K921 Melena: Secondary | ICD-10-CM

## 2020-09-02 DIAGNOSIS — K5901 Slow transit constipation: Secondary | ICD-10-CM

## 2020-09-02 MED ORDER — DOCUSATE SODIUM 100 MG PO CAPS: 100.0000 mg | ORAL_CAPSULE | Freq: Two times a day (BID) | ORAL | 2 refills | Status: AC

## 2020-09-02 MED ORDER — BENEFIBER DRINK MIX PO PACK: PACK | ORAL | 5 refills | Status: DC

## 2020-09-02 NOTE — Progress Notes (Signed)
Established Patient Office Visit  Subjective:  Patient ID: Walter Perez, male    DOB: 14-Jan-1956  Age: 65 y.o. MRN: [id number]  CC:  Chief Complaint  Patient presents with  . Rectal Bleeding    HPI EDDER BELLANCA presents for transfer of care from [facility], [place].  Walter Perez was born in [place] and came to [place] when Walter Perez was 12.  Walter Perez is married and has 2 children as well as grandchildren.  Walter Perez has a history of coronary artery bypass graft procedure x4 in 06-19-19, as well as essential hypertension, mixed hyperlipidemia, and monoclonal gammopathy.  Walter Perez is currently seeing cardiology as well as the cancer center.  Walter Perez does not have any other specialists at this time.  Today Walter Perez reports that over the last 3 to 6 months Walter Perez has noticed very intermittent bright red blood in his stool.  Walter Perez knows that Walter Perez has a history of hemorrhoids.  The last episode was about 3 days ago.  It is only on the toilet paper after Walter Perez wipes and only after having a large hard stool.  Walter Perez denies having any pain with bowel movements or pain in his abdomen.  Walter Perez has not had any dizziness or lightheadedness or headaches.  Walter Perez does not have any black tarry stools.  Last hemoglobin was 14.7 on 08-18-20.  Walter Perez does not have any other health concerns at this time.  Past Medical History:  Diagnosis Date  . Coronary artery disease   . Hyperlipidemia   . Hypertension   . Myocardial infarction ([place]) 06/19/2019    Past Surgical History:  Procedure Laterality Date  . CORONARY ARTERY BYPASS GRAFT  06/19/2019   quadruple    Family History  Problem Relation Age of Onset  . Stroke Mother   . Stroke Father   . Kidney disease Brother   . Alzheimer's disease Sister     Social History   Socioeconomic History  . Marital status: Married    Spouse name: Not on file  . Number of children: 2  . Years of education: Not on file  . Highest education level: Not on file  Occupational History  . Occupation: Unemployed  Tobacco Use  .  Smoking status: Never Smoker  . Smokeless tobacco: Never Used  Substance and Sexual Activity  . Alcohol use: Never  . Drug use: Never  . Sexual activity: Yes  Other Topics Concern  . Not on file  Social History Narrative  . Not on file   Social Determinants of Health   Financial Resource Strain: Not on file  Food Insecurity: Not on file  Transportation Needs: Not on file  Physical Activity: Not on file  Stress: Not on file  Social Connections: Not on file  Intimate Partner Violence: Not At Risk  . Fear of Current or Ex-Partner: No  . Emotionally Abused: No  . Physically Abused: No  . Sexually Abused: No    Outpatient Medications Prior to Visit  Medication Sig Dispense Refill  . aspirin 81 MG EC tablet Take 81 mg by mouth daily.     Marland Kitchen atorvastatin (LIPITOR) 80 MG tablet Take 1 tablet (80 mg total) by mouth daily at 6 PM. 90 tablet 1  . clopidogrel (PLAVIX) 75 MG tablet Take 1 tablet (75 mg total) by mouth daily. 90 tablet 1  . metoprolol succinate (TOPROL-XL) 100 MG 24 hr tablet Take 1 tablet (100 mg total) by mouth daily. Take with or immediately following a meal. 90 tablet 3  .  nitroGLYCERIN (NITROSTAT) 0.4 MG SL tablet     . olmesartan-hydrochlorothiazide (BENICAR HCT) 40-25 MG tablet Take 1 tablet by mouth daily. 90 tablet 1  . triamcinolone cream (KENALOG) 0.1 % Apply 1 application topically daily as needed. 453 g 3   Facility-Administered Medications Prior to Visit  Medication Dose Route Frequency Provider Last Rate Last Admin  . triamcinolone acetonide (KENALOG-40) injection 40 mg  40 mg Intramuscular Once Lavonna Monarch, MD        No Known Allergies  ROS Review of Systems REFER TO HPI FOR PERTINENT POSITIVES AND NEGATIVES    Objective:    Physical Exam Vitals and nursing note reviewed.  Constitutional:      Appearance: Normal appearance. Walter Perez is obese.  HENT:     Head: Normocephalic.     Right Ear: External ear normal.     Left Ear: External ear normal.      Nose: Nose normal.  Eyes:     Extraocular Movements: Extraocular movements intact.     Conjunctiva/sclera: Conjunctivae normal.     Pupils: Pupils are equal, round, and reactive to light.  Cardiovascular:     Pulses: Normal pulses.     Heart sounds: Normal heart sounds.  Pulmonary:     Effort: Pulmonary effort is normal.     Breath sounds: Normal breath sounds.  Abdominal:     General: Abdomen is flat. Bowel sounds are normal.     Palpations: Abdomen is soft. There is no mass.     Tenderness: There is no abdominal tenderness.     Comments: Rectal exam with chaperone present in room today.  No active bleeding.  No signs of anal fissure or external hemorrhoids present.  Did not perform internal exam due to patient discomfort.  Musculoskeletal:        General: Normal range of motion.  Skin:    General: Skin is warm.  Neurological:     Mental Status: Walter Perez is alert.  Psychiatric:        Mood and Affect: Mood normal.        Behavior: Behavior normal.     BP 138/82   Pulse 66   Temp 98 F (36.7 C)   Ht 5\' 7"  (1.702 m)   Wt 199 lb (90.3 kg)   SpO2 98%   BMI 31.17 kg/m  Wt Readings from Last 3 Encounters:  09/02/20 199 lb (90.3 kg)  08/25/20 201 lb 1.6 oz (91.2 kg)  08/06/20 201 lb (91.2 kg)     Health Maintenance Due  Topic Date Due  . HIV Screening  Never done  . Fecal DNA (Cologuard)  Never done    There are no preventive care reminders to display for this patient.  No results found for: TSH Lab Results  Component Value Date   WBC 5.6 08/18/2020   HGB 14.7 08/18/2020   HCT 45.0 08/18/2020   MCV 88.6 08/18/2020   PLT 186 08/18/2020   Lab Results  Component Value Date   NA 135 08/18/2020   K 3.3 (L) 08/18/2020   CO2 30 08/18/2020   GLUCOSE 173 (H) 08/18/2020   BUN 17 08/18/2020   CREATININE 1.16 08/18/2020   BILITOT 0.9 08/18/2020   ALKPHOS 60 08/18/2020   AST 27 08/18/2020   ALT 34 08/18/2020   PROT 7.7 08/18/2020   ALBUMIN 4.1 08/18/2020    CALCIUM 9.0 08/18/2020   ANIONGAP 8 08/18/2020   Lab Results  Component Value Date   CHOL 131 08/06/2020  Lab Results  Component Value Date   HDL 40 08/06/2020   Lab Results  Component Value Date   LDLCALC 66 08/06/2020   Lab Results  Component Value Date   TRIG 140 08/06/2020   Lab Results  Component Value Date   CHOLHDL 3.3 08/06/2020   Lab Results  Component Value Date   HGBA1C 5.9 (H) 08/06/2020      Assessment & Plan:   Problem List Items Addressed This Visit   None   Visit Diagnoses    Slow transit constipation    -  Primary   Hematochezia          Meds ordered this encounter  Medications  . docusate sodium (COLACE) 100 MG capsule    Sig: Take 1 capsule (100 mg total) by mouth 2 (two) times daily.    Dispense:  60 capsule    Refill:  2  . Wheat Dextrin (BENEFIBER DRINK MIX) PACK    Sig: Please mix per packaging instructions daily to increase fiber and help with constipation.    Dispense:  24 each    Refill:  5    Follow-up: Return in about 6 months (around 03/05/2021) for cpe.   1. Slow transit constipation 2. Hematochezia Very intermittent bright red blood present most likely due to intermittent constipation.  I recommend stool softeners and fiber in diet which I prescribed today.  Walter Perez is also going to increase his water intake and activity level.  Walter Perez is going to monitor the symptoms carefully and let me know if anything changes.  I do not feel it necessary to stop his aspirin or Plavix at this time.  Walter Perez states that Walter Perez has had 2 previous Cologuard test done and both have been negative.  Walter Perez does not want to do a colonoscopy.  This visit occurred during the SARS-CoV-2 public health emergency.  Safety protocols were in place, including screening questions prior to the visit, additional usage of staff PPE, and extensive cleaning of exam room while observing appropriate contact time as indicated for disinfecting solutions.    Chauna Osoria M Nataleigh Griffin, PA-C

## 2020-09-02 NOTE — Patient Instructions (Addendum)
It was great to meet you today.  Please follow-up with your regular specialist.  I will see you back in about 6 months for complete physical.  I think you are having some constipation issues which is causing the intermittent very light bright red blood in your stool.  I would like for you to start a stool softener daily as well as a fiber supplement.  Please increase your water intake to 60 to 100 ounces daily.  Continue to stay active and eat a well-balanced diet.  Call immediately for any excessive blood in the stool, feeling dizzy, black tarry stools, or any pain.       Constipation, Adult Constipation is when a person has trouble pooping (having a bowel movement). When you have this condition, you may poop fewer than 3 times a week. Your poop (stool) may also be dry, hard, or bigger than normal. Follow these instructions at home: Eating and drinking  Eat foods that have a lot of fiber, such as: ? Fresh fruits and vegetables. ? Whole grains. ? Beans.  Eat less of foods that are low in fiber and high in fat and sugar, such as: ? [place] fries. ? Hamburgers. ? Cookies. ? Candy. ? Soda.  Drink enough fluid to keep your pee (urine) pale yellow.   General instructions  Exercise regularly or as told by your doctor. Try to do 150 minutes of exercise each week.  Go to the restroom when you feel like you need to poop. Do not hold it in.  Take over-the-counter and prescription medicines only as told by your doctor. These include any fiber supplements.  When you poop: ? Do deep breathing while relaxing your lower belly (abdomen). ? Relax your pelvic floor. The pelvic floor is a group of muscles that support the rectum, bladder, and intestines (as well as the uterus in women).  Watch your condition for any changes. Tell your doctor if you notice any.  Keep all follow-up visits as told by your doctor. This is important. Contact a doctor if:  You have pain that gets worse.  You have  a fever.  You have not pooped for 4 days.  You vomit.  You are not hungry.  You lose weight.  You are bleeding from the opening of the butt (anus).  You have thin, pencil-like poop. Get help right away if:  You have a fever, and your symptoms suddenly get worse.  You leak poop or have blood in your poop.  Your belly feels hard or bigger than normal (bloated).  You have very bad belly pain.  You feel dizzy or you faint. Summary  Constipation is when a person poops fewer than 3 times a week, has trouble pooping, or has poop that is dry, hard, or bigger than normal.  Eat foods that have a lot of fiber.  Drink enough fluid to keep your pee (urine) pale yellow.  Take over-the-counter and prescription medicines only as told by your doctor. These include any fiber supplements. This information is not intended to replace advice given to you by your health care provider. Make sure you discuss any questions you have with your health care provider. Document Revised: 05/07/2019 Document Reviewed: 05/07/2019 Elsevier Patient Education  [place].

## 2020-11-30 ENCOUNTER — Encounter: Payer: Self-pay | Admitting: Physician Assistant

## 2020-12-01 MED ORDER — OLMESARTAN MEDOXOMIL-HCTZ 40-25 MG PO TABS: 1.0000 | ORAL_TABLET | Freq: Every day | ORAL | 1 refills | Status: DC

## 2020-12-01 MED ORDER — CLOPIDOGREL BISULFATE 75 MG PO TABS: 75.0000 mg | ORAL_TABLET | Freq: Every day | ORAL | 1 refills | Status: DC

## 2020-12-01 MED ORDER — ATORVASTATIN CALCIUM 80 MG PO TABS: 80.0000 mg | ORAL_TABLET | Freq: Every day | ORAL | 1 refills | Status: DC

## 2020-12-08 ENCOUNTER — Encounter: Payer: Self-pay | Admitting: Family Medicine

## 2020-12-08 ENCOUNTER — Telehealth: Payer: Self-pay

## 2020-12-08 ENCOUNTER — Ambulatory Visit (INDEPENDENT_AMBULATORY_CARE_PROVIDER_SITE_OTHER): Payer: 59 | Admitting: Family Medicine

## 2020-12-08 ENCOUNTER — Other Ambulatory Visit: Payer: Self-pay

## 2020-12-08 VITALS — BP 150/90 | HR 94 | Temp 98.9°F | Ht 67.0 in | Wt 195.8 lb

## 2020-12-08 DIAGNOSIS — R310 Gross hematuria: Secondary | ICD-10-CM

## 2020-12-08 DIAGNOSIS — R300 Dysuria: Secondary | ICD-10-CM

## 2020-12-08 DIAGNOSIS — N390 Urinary tract infection, site not specified: Secondary | ICD-10-CM

## 2020-12-08 DIAGNOSIS — R319 Hematuria, unspecified: Secondary | ICD-10-CM | POA: Diagnosis not present

## 2020-12-08 LAB — POCT URINALYSIS DIPSTICK
Bilirubin, UA: NEGATIVE
Glucose, UA: NEGATIVE
Ketones, UA: NEGATIVE
Nitrite, UA: NEGATIVE
Protein, UA: POSITIVE — AB
Spec Grav, UA: 1.015 (ref 1.010–1.025)
Urobilinogen, UA: 0.2 E.U./dL
pH, UA: 6 (ref 5.0–8.0)

## 2020-12-08 MED ORDER — CIPROFLOXACIN HCL 500 MG PO TABS: 500.0000 mg | ORAL_TABLET | Freq: Two times a day (BID) | ORAL | 0 refills | Status: AC

## 2020-12-08 NOTE — Patient Instructions (Signed)
Please return if not improving OR If worsening: having recurrent fevers or back pain, nausea.  Please take the antibiotics and monitor your temperature.   If you have any questions or concerns, please don't hesitate to send me a message via MyChart or call the office at [phone]. Thank you for visiting with [place] today! It's our pleasure caring for you.   Urinary Tract Infection, Adult A urinary tract infection (UTI) is an infection of any part of the urinary tract. The urinary tract includes:  The kidneys.  The ureters.  The bladder.  The urethra. These organs make, store, and get rid of pee (urine) in the body. What are the causes? This infection is caused by germs (bacteria) in your genital area. These germs grow and cause swelling (inflammation) of your urinary tract. What increases the risk? The following factors may make you more likely to develop this condition:  Using a small, thin tube (catheter) to drain pee.  Not being able to control when you pee or poop (incontinence).  Being male. If you are male, these things can increase the risk: ? Using these methods to prevent pregnancy:  A medicine that kills sperm (spermicide).  A device that blocks sperm (diaphragm). ? Having low levels of a male hormone (estrogen). ? Being pregnant. You are more likely to develop this condition if:  You have genes that add to your risk.  You are sexually active.  You take antibiotic medicines.  You have trouble peeing because of: ? A prostate that is bigger than normal, if you are male. ? A blockage in the part of your body that drains pee from the bladder. ? A kidney stone. ? A nerve condition that affects your bladder. ? Not getting enough to drink. ? Not peeing often enough.  You have other conditions, such as: ? Diabetes. ? A weak disease-fighting system (immune system). ? Sickle cell disease. ? Gout. ? Injury of the spine. What are the signs or  symptoms? Symptoms of this condition include:  Needing to pee right away.  Peeing small amounts often.  Pain or burning when peeing.  Blood in the pee.  Pee that smells bad or not like normal.  Trouble peeing.  Pee that is cloudy.  Fluid coming from the vagina, if you are male.  Pain in the belly or lower back. Other symptoms include:  Vomiting.  Not feeling hungry.  Feeling mixed up (confused). This may be the first symptom in older adults.  Being tired and grouchy (irritable).  A fever.  Watery poop (diarrhea). How is this treated?  Taking antibiotic medicine.  Taking other medicines.  Drinking enough water. In some cases, you may need to see a specialist. Follow these instructions at home: Medicines  Take over-the-counter and prescription medicines only as told by your doctor.  If you were prescribed an antibiotic medicine, take it as told by your doctor. Do not stop taking it even if you start to feel better. General instructions  Make sure you: ? Pee until your bladder is empty. ? Do not hold pee for a long time. ? Empty your bladder after sex. ? Wipe from front to back after peeing or pooping if you are a male. Use each tissue one time when you wipe.  Drink enough fluid to keep your pee pale yellow.  Keep all follow-up visits.   Contact a doctor if:  You do not get better after 1-2 days.  Your symptoms go away and then come back.  Get help right away if:  You have very bad back pain.  You have very bad pain in your lower belly.  You have a fever.  You have chills.  You feeling like you will vomit or you vomit. Summary  A urinary tract infection (UTI) is an infection of any part of the urinary tract.  This condition is caused by germs in your genital area.  There are many risk factors for a UTI.  Treatment includes antibiotic medicines.  Drink enough fluid to keep your pee pale yellow. This information is not intended to  replace advice given to you by your health care provider. Make sure you discuss any questions you have with your health care provider. Document Revised: 01/30/2020 Document Reviewed: 01/30/2020 Elsevier Patient Education  [place].

## 2020-12-08 NOTE — Progress Notes (Signed)
Subjective  CC:  Chief Complaint  Patient presents with  . Hematuria  . Fatigue  . Numbness    Fingers, right hand specifically   Same day acute visit; PCP not available. New pt to me. Chart reviewed.   HPI: Walter Perez is a 65 y.o. male who presents to the office today to address the problems listed above in the chief complaint.  65 year old male with history of coronary disease, smoldering myeloma presents with gross hematuria that started early in the morning.  2 days prior he reports a fever to 100 and shaking chills.  He reports malaise and weakness.  Symptoms responded to Tylenol.  Since, his energy level is a little bit low and he reports new onset dysuria.  Gross hematuria has lightened over the course of the last 12 hours.  He denies penile discharge or rash.  He denies low back pain or flank pain.  No nausea, vomiting, diarrhea, cough or URI symptoms.  He has no shortness of breath.  No history of bladder or kidney infections.  No history of prostate infections.  Review of systems is positive for right hand intermittent numbness over the last several months.  Likely unrelated.  Assessment  1. Urinary tract infection with hematuria, site unspecified   2. Hematuria, unspecified type   3. Gross hematuria   4. Dysuria      Plan   Symptom complex most consistent with urinary tract infection, lower versus upper: Await urine culture.  Start Cipro twice daily.  Push fluids and monitor symptoms.  Patient to monitor temperature.  He appears nontoxic.  We will follow-up if symptoms or not proving over the next 24 to 48 hours.  Check renal function and CBC.  Counseling given.  He will follow-up with PCP regarding his hand numbness.  Possible carpal tunnel  Follow up: As needed 01/10/2021  Orders Placed This Encounter  Procedures  . Urine Culture  . CBC with Differential/Platelet  . Basic metabolic panel  . POCT Urinalysis Dipstick   Meds ordered this encounter  Medications   . ciprofloxacin (CIPRO) 500 MG tablet    Sig: Take 1 tablet (500 mg total) by mouth 2 (two) times daily for 7 days.    Dispense:  14 tablet    Refill:  0      I reviewed the patients updated PMH, FH, and SocHx.    Patient Active Problem List   Diagnosis Date Noted  . S/P CABG x 4 07/20/2019  . Coronary artery disease involving native coronary artery of native heart 06/12/2019  . Monoclonal gammopathy 04/29/2019  . Mixed hyperlipidemia 05/20/2018  . Elevated PSA 12/05/2017  . Erectile dysfunction 01/12/2017  . Hypokalemia 05/17/2016  . Ganglion cyst of left foot 07/22/2015  . Essential (primary) hypertension 09/02/2011   Current Meds  Medication Sig  . aspirin 81 MG EC tablet Take 81 mg by mouth daily.   Marland Kitchen atorvastatin (LIPITOR) 80 MG tablet Take 1 tablet (80 mg total) by mouth daily at 6 PM.  . ciprofloxacin (CIPRO) 500 MG tablet Take 1 tablet (500 mg total) by mouth 2 (two) times daily for 7 days.  . clopidogrel (PLAVIX) 75 MG tablet Take 1 tablet (75 mg total) by mouth daily.  . metoprolol succinate (TOPROL-XL) 100 MG 24 hr tablet Take 1 tablet (100 mg total) by mouth daily. Take with or immediately following a meal.  . nitroGLYCERIN (NITROSTAT) 0.4 MG SL tablet   . olmesartan-hydrochlorothiazide (BENICAR HCT) 40-25 MG tablet  Take 1 tablet by mouth daily.  Marland Kitchen triamcinolone cream (KENALOG) 0.1 % Apply 1 application topically daily as needed.  . Wheat Dextrin (BENEFIBER DRINK MIX) PACK Please mix per packaging instructions daily to increase fiber and help with constipation.   Current Facility-Administered Medications for the 12/08/20 encounter (Office Visit) with Leamon Arnt, MD  Medication  . triamcinolone acetonide (KENALOG-40) injection 40 mg    Allergies: Patient has No Known Allergies. Family History: Patient family history includes Alzheimer's disease in his sister; Kidney disease in his brother; Stroke in his father and mother. Social History:  Patient  reports  that he has never smoked. He has never used smokeless tobacco. He reports that he does not drink alcohol and does not use drugs.  Review of Systems: Constitutional: Negative for fever malaise or anorexia Cardiovascular: negative for chest pain Respiratory: negative for SOB or persistent cough Gastrointestinal: negative for abdominal pain  Objective  Vitals: BP (!) 150/90   Pulse 94   Temp 98.9 F (37.2 C) (Temporal)   Ht 5\' 7"  (1.702 m)   Wt 195 lb 12.8 oz (88.8 kg)   SpO2 97%   BMI 30.67 kg/m  General: no acute distress , A&Ox3 HEENT: PEERL, conjunctiva normal, neck is supple Cardiovascular:  RRR without murmur or gallop.  Respiratory:  Good breath sounds bilaterally, CTAB with normal respiratory effort Benign abdomen, no CVA tenderness, mild suprapubic tenderness Skin:  Warm, no rashes  Office Visit on 12/08/2020  Component Date Value Ref Range Status  . Color, UA 12/08/2020 amber   Final  . Clarity, UA 12/08/2020 cloudy   Final  . Glucose, UA 12/08/2020 Negative  Negative Final  . Bilirubin, UA 12/08/2020 negative   Final  . Ketones, UA 12/08/2020 negative   Final  . Spec Grav, UA 12/08/2020 1.015  1.010 - 1.025 Final  . Blood, UA 12/08/2020 3+   Final  . pH, UA 12/08/2020 6.0  5.0 - 8.0 Final  . Protein, UA 12/08/2020 Positive* Negative Final  . Urobilinogen, UA 12/08/2020 0.2  0.2 or 1.0 E.U./dL Final  . Nitrite, UA 12/08/2020 negative   Final  . Leukocytes, UA 12/08/2020 Small (1+)* Negative Final      Commons side effects, risks, benefits, and alternatives for medications and treatment plan prescribed today were discussed, and the patient expressed understanding of the given instructions. Patient is instructed to call or message via MyChart if he/she has any questions or concerns regarding our treatment plan. No barriers to understanding were identified. We discussed Red Flag symptoms and signs in detail. Patient expressed understanding regarding what to do in case  of urgent or emergency type symptoms.   Medication list was reconciled, printed and provided to the patient in AVS. Patient instructions and summary information was reviewed with the patient as documented in the AVS. This note was prepared with assistance of Dragon voice recognition software. Occasional wrong-word or sound-a-like substitutions may have occurred due to the inherent limitations of voice recognition software  This visit occurred during the SARS-CoV-2 public health emergency.  Safety protocols were in place, including screening questions prior to the visit, additional usage of staff PPE, and extensive cleaning of exam room while observing appropriate contact time as indicated for disinfecting solutions.

## 2020-12-08 NOTE — Telephone Encounter (Signed)
Patient is scheduled for today with Dr.Andy.  Nurse Assessment Nurse: Clovis Riley RN, Georgina Peer Date/Time (Eastern Time): 12/08/2020 9:57:51 AM Confirm and document reason for call. If symptomatic, describe symptoms. ---Caller states that he has blood in the urine that started this morning. Yesterday he was feeling some weakness, and had a fever of 100.0 yesterday. Does the patient have any new or worsening symptoms? ---Yes Will a triage be completed? ---Yes Related visit to physician within the last 2 weeks? ---No Does the PT have any chronic conditions? (i.e. diabetes, asthma, this includes High risk factors for pregnancy, etc.) ---Yes List chronic conditions. ---HTN, blood thinner, Hx open heart Is this a behavioral health or substance abuse call? ---No Guidelines Guideline Title Affirmed Question Affirmed Notes Nurse Date/Time (Eastern Time) Urine - Blood In Taking Coumadin (warfarin) or other strong blood thinner, or known bleeding disorder (e.g., thrombocytopenia) Clovis Riley, RN, Georgina Peer 12/08/2020 9:59:39 AM PLEASE NOTE: All timestamps contained within this report are represented as [place] Standard Time. CONFIDENTIALTY NOTICE: This fax transmission is intended only for the addressee. It contains information that is legally privileged, confidential or otherwise protected from use or disclosure. If you are not the intended recipient, you are strictly prohibited from reviewing, disclosing, copying using or disseminating any of this information or taking any action in reliance on or regarding this information. If you have received this fax in error, please notify us immediately by telephone so that we can arrange for its return to [place]. Phone: [phone], Toll-Free: [phone], Fax: [phone] Page: 2 of 2 Call Id: [id number] [place]. Time Eilene Ghazi Time) Disposition Final User 12/08/2020 10:02:18 AM See HCP within 4 Hours (or PCP triage) Yes Clovis Riley, RN, Leilani Merl Disagree/Comply  Comply Caller Understands Yes PreDisposition Call Doctor Care Advice Given Per Guideline SEE HCP (OR PCP TRIAGE) WITHIN 4 HOURS: * IF OFFICE WILL BE OPEN: You need to be seen within the next 3 or 4 hours. Call your doctor (or NP/PA) now or as soon as the office opens. CALL BACK IF: * Fever occurs * You become worse CARE ADVICE given per Urine, Blood In (Adult) guideline. Referrals Warm transfer to backlin

## 2020-12-08 NOTE — Telephone Encounter (Signed)
FYI

## 2020-12-09 LAB — CBC WITH DIFFERENTIAL/PLATELET
Basophils Absolute: 0.1 10*3/uL (ref 0.0–0.1)
Basophils Relative: 0.7 % (ref 0.0–3.0)
Eosinophils Absolute: 0.1 10*3/uL (ref 0.0–0.7)
Eosinophils Relative: 0.8 % (ref 0.0–5.0)
HCT: 40.6 % (ref 39.0–52.0)
Hemoglobin: 14 g/dL (ref 13.0–17.0)
Lymphocytes Relative: 7.1 % — ABNORMAL LOW (ref 12.0–46.0)
Lymphs Abs: 0.7 10*3/uL (ref 0.7–4.0)
MCHC: 34.4 g/dL (ref 30.0–36.0)
MCV: 85 fl (ref 78.0–100.0)
Monocytes Absolute: 0.8 10*3/uL (ref 0.1–1.0)
Monocytes Relative: 8.1 % (ref 3.0–12.0)
Neutro Abs: 7.7 10*3/uL (ref 1.4–7.7)
Neutrophils Relative %: 83.3 % — ABNORMAL HIGH (ref 43.0–77.0)
Platelets: 166 10*3/uL (ref 150.0–400.0)
RBC: 4.78 Mil/uL (ref 4.22–5.81)
RDW: 13.9 % (ref 11.5–15.5)
WBC: 9.3 10*3/uL (ref 4.0–10.5)

## 2020-12-09 LAB — BASIC METABOLIC PANEL
BUN: 17 mg/dL (ref 6–23)
CO2: 28 mEq/L (ref 19–32)
Calcium: 8.7 mg/dL (ref 8.4–10.5)
Chloride: 93 mEq/L — ABNORMAL LOW (ref 96–112)
Creatinine, Ser: 1.33 mg/dL (ref 0.40–1.50)
GFR: 57.09 mL/min — ABNORMAL LOW (ref >60.00)
Glucose, Bld: 100 mg/dL — ABNORMAL HIGH (ref 70–99)
Potassium: 3 mEq/L — ABNORMAL LOW (ref 3.5–5.1)
Sodium: 132 mEq/L — ABNORMAL LOW (ref 135–145)

## 2020-12-10 ENCOUNTER — Encounter: Payer: Self-pay | Admitting: Family Medicine

## 2020-12-10 LAB — URINE CULTURE
MICRO NUMBER:: 11983832
SPECIMEN QUALITY:: ADEQUATE

## 2020-12-10 NOTE — Telephone Encounter (Signed)
Patient called in requesting an apt, and I explained we had no apt until next week and reccommended urgent care patient voiced understanding but did not agree or disagree to go

## 2020-12-13 ENCOUNTER — Other Ambulatory Visit: Payer: Self-pay

## 2020-12-13 ENCOUNTER — Telehealth: Payer: Self-pay

## 2020-12-13 DIAGNOSIS — R310 Gross hematuria: Secondary | ICD-10-CM

## 2020-12-13 MED ORDER — SULFAMETHOXAZOLE-TRIMETHOPRIM 800-160 MG PO TABS: 1.0000 | ORAL_TABLET | Freq: Two times a day (BID) | ORAL | 0 refills | Status: DC

## 2020-12-13 NOTE — Telephone Encounter (Signed)
Patient returned call for lab results.  

## 2020-12-13 NOTE — Progress Notes (Signed)
Please call patient: I have reviewed his/her lab results. Please call patient. Needs different antibiotic; please stop cipro and order septra ds 1 po bid x 7 days. thanks

## 2021-01-10 ENCOUNTER — Ambulatory Visit: Payer: 59 | Admitting: Physician Assistant

## 2021-02-16 ENCOUNTER — Inpatient Hospital Stay (HOSPITAL_COMMUNITY): Payer: 59

## 2021-02-18 ENCOUNTER — Other Ambulatory Visit: Payer: Self-pay

## 2021-02-18 ENCOUNTER — Inpatient Hospital Stay (HOSPITAL_COMMUNITY): Payer: 59 | Attending: Hematology

## 2021-02-18 DIAGNOSIS — E785 Hyperlipidemia, unspecified: Secondary | ICD-10-CM | POA: Diagnosis not present

## 2021-02-18 DIAGNOSIS — Z951 Presence of aortocoronary bypass graft: Secondary | ICD-10-CM | POA: Diagnosis not present

## 2021-02-18 DIAGNOSIS — C9000 Multiple myeloma not having achieved remission: Secondary | ICD-10-CM

## 2021-02-18 DIAGNOSIS — I2510 Atherosclerotic heart disease of native coronary artery without angina pectoris: Secondary | ICD-10-CM | POA: Diagnosis not present

## 2021-02-18 DIAGNOSIS — I252 Old myocardial infarction: Secondary | ICD-10-CM | POA: Diagnosis not present

## 2021-02-18 DIAGNOSIS — I10 Essential (primary) hypertension: Secondary | ICD-10-CM | POA: Diagnosis not present

## 2021-02-18 DIAGNOSIS — D472 Monoclonal gammopathy: Secondary | ICD-10-CM | POA: Insufficient documentation

## 2021-02-18 DIAGNOSIS — Z7982 Long term (current) use of aspirin: Secondary | ICD-10-CM | POA: Diagnosis not present

## 2021-02-18 DIAGNOSIS — Z79899 Other long term (current) drug therapy: Secondary | ICD-10-CM | POA: Insufficient documentation

## 2021-02-18 LAB — CBC WITH DIFFERENTIAL/PLATELET
Abs Immature Granulocytes: 0.02 10*3/uL (ref 0.00–0.07)
Basophils Absolute: 0 10*3/uL (ref 0.0–0.1)
Basophils Relative: 1 %
Eosinophils Absolute: 0.4 10*3/uL (ref 0.0–0.5)
Eosinophils Relative: 7 %
HCT: 42.6 % (ref 39.0–52.0)
Hemoglobin: 14.2 g/dL (ref 13.0–17.0)
Immature Granulocytes: 0 %
Lymphocytes Relative: 22 %
Lymphs Abs: 1.2 10*3/uL (ref 0.7–4.0)
MCH: 29.6 pg (ref 26.0–34.0)
MCHC: 33.3 g/dL (ref 30.0–36.0)
MCV: 88.8 fL (ref 80.0–100.0)
Monocytes Absolute: 0.5 10*3/uL (ref 0.1–1.0)
Monocytes Relative: 9 %
Neutro Abs: 3.5 10*3/uL (ref 1.7–7.7)
Neutrophils Relative %: 61 %
Platelets: 161 10*3/uL (ref 150–400)
RBC: 4.8 MIL/uL (ref 4.22–5.81)
RDW: 13 % (ref 11.5–15.5)
WBC: 5.6 10*3/uL (ref 4.0–10.5)
nRBC: 0 % (ref 0.0–0.2)

## 2021-02-18 LAB — COMPREHENSIVE METABOLIC PANEL
ALT: 23 U/L (ref 0–44)
AST: 21 U/L (ref 15–41)
Albumin: 4.1 g/dL (ref 3.5–5.0)
Alkaline Phosphatase: 59 U/L (ref 38–126)
Anion gap: 6 (ref 5–15)
BUN: 14 mg/dL (ref 8–23)
CO2: 30 mmol/L (ref 22–32)
Calcium: 8.9 mg/dL (ref 8.9–10.3)
Chloride: 98 mmol/L (ref 98–111)
Creatinine, Ser: 1.09 mg/dL (ref 0.61–1.24)
GFR, Estimated: >60 mL/min (ref >60)
Glucose, Bld: 106 mg/dL — ABNORMAL HIGH (ref 70–99)
Potassium: 3.5 mmol/L (ref 3.5–5.1)
Sodium: 134 mmol/L — ABNORMAL LOW (ref 135–145)
Total Bilirubin: 1.1 mg/dL (ref 0.3–1.2)
Total Protein: 8 g/dL (ref 6.5–8.1)

## 2021-02-18 LAB — LACTATE DEHYDROGENASE: LDH: 156 U/L (ref 98–192)

## 2021-02-21 LAB — PROTEIN ELECTROPHORESIS, SERUM
A/G Ratio: 1.2 (ref 0.7–1.7)
Albumin ELP: 4 g/dL (ref 2.9–4.4)
Alpha-1-Globulin: 0.1 g/dL (ref 0.0–0.4)
Alpha-2-Globulin: 0.6 g/dL (ref 0.4–1.0)
Beta Globulin: 0.7 g/dL (ref 0.7–1.3)
Gamma Globulin: 2 g/dL — ABNORMAL HIGH (ref 0.4–1.8)
Globulin, Total: 3.4 g/dL (ref 2.2–3.9)
M-Spike, %: 1.7 g/dL — ABNORMAL HIGH
Total Protein ELP: 7.4 g/dL (ref 6.0–8.5)

## 2021-02-21 LAB — KAPPA/LAMBDA LIGHT CHAINS
Kappa free light chain: 10.4 mg/L (ref 3.3–19.4)
Kappa, lambda light chain ratio: 0.04 — ABNORMAL LOW (ref 0.26–1.65)
Lambda free light chains: 266.5 mg/L — ABNORMAL HIGH (ref 5.7–26.3)

## 2021-02-23 ENCOUNTER — Telehealth (HOSPITAL_COMMUNITY): Payer: 59 | Admitting: Oncology

## 2021-02-24 ENCOUNTER — Other Ambulatory Visit (HOSPITAL_COMMUNITY): Payer: Self-pay

## 2021-02-24 ENCOUNTER — Other Ambulatory Visit: Payer: Self-pay

## 2021-02-24 ENCOUNTER — Inpatient Hospital Stay (HOSPITAL_BASED_OUTPATIENT_CLINIC_OR_DEPARTMENT_OTHER): Payer: 59 | Admitting: Nurse Practitioner

## 2021-02-24 ENCOUNTER — Encounter (HOSPITAL_COMMUNITY): Payer: Self-pay | Admitting: Nurse Practitioner

## 2021-02-24 VITALS — BP 143/97 | HR 60 | Temp 98.0°F | Resp 18 | Wt 193.0 lb

## 2021-02-24 DIAGNOSIS — C9000 Multiple myeloma not having achieved remission: Secondary | ICD-10-CM

## 2021-02-24 DIAGNOSIS — D472 Monoclonal gammopathy: Secondary | ICD-10-CM

## 2021-02-24 NOTE — Progress Notes (Signed)
[place], [place] [address]   Virtual Visit Progress Note  I connected with Tawni Levy on 02/24/21 at  9:00 AM EDT by video enabled telemedicine visit and verified that I am speaking with the correct person using two identifiers.   I discussed the limitations, risks, security and privacy concerns of performing an evaluation and management service by telemedicine and the availability of in-person appointments. I also discussed with the patient that there may be a patient responsible charge related to this service. The patient expressed understanding and agreed to proceed.   Other persons participating in the visit and their role in the encounter: RN, NP, Patient   Patient's location: [facility]  Provider's location: [facility]   Chief Complaint: Smoldering Myeloma   CLINIC:  Medical Oncology/Hematology  PCP:  Fredirick Lathe, PA-C [place] / [place] [address]  [phone]  REASON FOR VISIT:  Follow-up for smoldering myeloma  PRIOR THERAPY: None  CURRENT THERAPY: Observation  INTERVAL HISTORY:  Mr. Walter Perez, a 65 y.o. male, returns for routine follow-up for his smoldering myeloma. He continues to feel well. He denies any recent infections, fevers, chills, night sweats. No interval infections or hospitalizations. No leg swelling or new bone pains. He feels well and denies complaints.    REVIEW OF SYSTEMS:  Review of Systems  Constitutional:  Negative for appetite change, chills, fatigue, fever and unexpected weight change.  HENT:   Negative for mouth sores, sore throat and trouble swallowing.   Respiratory:  Negative for chest tightness and shortness of breath.   Cardiovascular:  Negative for leg swelling.  Gastrointestinal:  Negative for abdominal pain, constipation, diarrhea, nausea and vomiting.  Genitourinary:  Negative for bladder incontinence and dysuria.   Musculoskeletal:  Negative for arthralgias, back pain,  flank pain, myalgias, neck pain and neck stiffness.  Skin:  Negative for itching, rash and wound.  Neurological:  Negative for dizziness, headaches, light-headedness and numbness.  Hematological:  Negative for adenopathy. Does not bruise/bleed easily.  Psychiatric/Behavioral:  Negative for confusion, depression and sleep disturbance. The patient is not nervous/anxious.    PAST MEDICAL/SURGICAL HISTORY:  Past Medical History:  Diagnosis Date   Coronary artery disease    Hyperlipidemia    Hypertension    Myocardial infarction ([place]) 06/19/2019   Past Surgical History:  Procedure Laterality Date   CORONARY ARTERY BYPASS GRAFT  06/19/2019   quadruple    SOCIAL HISTORY:  Social History   Socioeconomic History   Marital status: Married    Spouse name: Not on file   Number of children: 2   Years of education: Not on file   Highest education level: Not on file  Occupational History   Occupation: Unemployed  Tobacco Use   Smoking status: Never   Smokeless tobacco: Never  Substance and Sexual Activity   Alcohol use: Never   Drug use: Never   Sexual activity: Yes  Other Topics Concern   Not on file  Social History Narrative   Not on file   Social Determinants of Health   Financial Resource Strain: Not on file  Food Insecurity: Not on file  Transportation Needs: Not on file  Physical Activity: Not on file  Stress: Not on file  Social Connections: Not on file  Intimate Partner Violence: Not At Risk   Fear of Current or Ex-Partner: No   Emotionally Abused: No   Physically Abused: No   Sexually Abused: No  FAMILY HISTORY:  Family History  Problem Relation Age of Onset   Stroke Mother    Stroke Father    Kidney disease Brother    Alzheimer's disease Sister     CURRENT MEDICATIONS:  Current Outpatient Medications  Medication Sig Dispense Refill   aspirin 81 MG EC tablet Take 81 mg by mouth daily.      atorvastatin (LIPITOR) 80 MG tablet Take 1 tablet (80 mg  total) by mouth daily at 6 PM. 90 tablet 1   clopidogrel (PLAVIX) 75 MG tablet Take 1 tablet (75 mg total) by mouth daily. 90 tablet 1   metoprolol succinate (TOPROL-XL) 100 MG 24 hr tablet Take 1 tablet (100 mg total) by mouth daily. Take with or immediately following a meal. 90 tablet 3   nitroGLYCERIN (NITROSTAT) 0.4 MG SL tablet      olmesartan-hydrochlorothiazide (BENICAR HCT) 40-25 MG tablet Take 1 tablet by mouth daily. 90 tablet 1   sulfamethoxazole-trimethoprim (BACTRIM DS) 800-160 MG tablet Take 1 tablet by mouth 2 (two) times daily. 14 tablet 0   triamcinolone cream (KENALOG) 0.1 % Apply 1 application topically daily as needed. 453 g 3   Wheat Dextrin (BENEFIBER DRINK MIX) PACK Please mix per packaging instructions daily to increase fiber and help with constipation. 24 each 5   Current Facility-Administered Medications  Medication Dose Route Frequency Provider Last Rate Last Admin   triamcinolone acetonide (KENALOG-40) injection 40 mg  40 mg Intramuscular Once Lavonna Monarch, MD        ALLERGIES:  No Known Allergies  PHYSICAL EXAM:  Performance status (ECOG): 0 - Asymptomatic  Vitals:   02/24/21 0857  BP: (!) 143/97  Pulse: 60  Resp: 18  Temp: 98 F (36.7 C)  SpO2: 97%   Wt Readings from Last 3 Encounters:  02/24/21 193 lb (87.5 kg)  12/08/20 195 lb 12.8 oz (88.8 kg)  09/02/20 199 lb (90.3 kg)   Physical Exam Vitals reviewed.  Constitutional:      Appearance: He is not ill-appearing.  Pulmonary:     Effort: No respiratory distress.  Skin:    Coloration: Skin is not pale.  Neurological:     Mental Status: He is alert and oriented to person, place, and time.  Psychiatric:        Mood and Affect: Mood normal.        Behavior: Behavior normal.    LABORATORY DATA:  I have reviewed the labs as listed.  CBC Latest Ref Rng & Units 02/18/2021 12/08/2020 08/18/2020  WBC 4.0 - 10.5 K/uL 5.6 9.3 5.6  Hemoglobin 13.0 - 17.0 g/dL 14.2 14.0 14.7  Hematocrit 39.0 - 52.0 %  42.6 40.6 45.0  Platelets 150 - 400 K/uL 161 166.0 186   CMP Latest Ref Rng & Units 02/18/2021 12/08/2020 08/18/2020  Glucose 70 - 99 mg/dL 106(H) 100(H) 173(H)  BUN 8 - 23 mg/dL _0 Creatinine 0.61 - 1.24 mg/dL 1.09 1.33 1.16  Sodium 135 - 145 mmol/L 134(L) 132(L) 135  Potassium 3.5 - 5.1 mmol/L 3.5 3.0(L) 3.3(L)  Chloride 98 - 111 mmol/L 98 93(L) 97(L)  CO2 22 - 32 mmol/L _1 Calcium 8.9 - 10.3 mg/dL 8.9 8.7 9.0  Total Protein 6.5 - 8.1 g/dL 8.0 - 7.7  Total Bilirubin 0.3 - 1.2 mg/dL 1.1 - 0.9  Alkaline Phos 38 - 126 U/L 59 - 60  AST 15 - 41 U/L 21 - 27  ALT 0 - 44 U/L 23 - 34  Component Value Date/Time   RBC 4.80 02/18/2021 0930   MCV 88.8 02/18/2021 0930   MCV 86 08/06/2020 1607   MCH 29.6 02/18/2021 0930   MCHC 33.3 02/18/2021 0930   RDW 13.0 02/18/2021 0930   RDW 13.1 08/06/2020 1607   LYMPHSABS 1.2 02/18/2021 0930   MONOABS 0.5 02/18/2021 0930   EOSABS 0.4 02/18/2021 0930   BASOSABS 0.0 02/18/2021 0930   Lab Results  Component Value Date   LDH 156 02/18/2021   LDH 166 08/18/2020   LDH 157 04/13/2020   Lab Results  Component Value Date   TOTALPROTELP 7.4 02/18/2021   ALBUMINELP 4.0 02/18/2021   A1GS 0.1 02/18/2021   A2GS 0.6 02/18/2021   BETS 0.7 02/18/2021   GAMS 2.0 (H) 02/18/2021   MSPIKE 1.7 (H) 02/18/2021   SPEI Comment 02/18/2021    Lab Results  Component Value Date   KPAFRELGTCHN 10.4 02/18/2021   LAMBDASER 266.5 (H) 02/18/2021   KAPLAMBRATIO 0.04 (L) 02/18/2021    DIAGNOSTIC IMAGING:  I have independently reviewed the scans and discussed with the patient.  DG Bone survey met- 08/18/2020 - stable sclerotic focus in right sacrum. Benign appearing. No lytic or destructive bone lesions. No findings of active multiple myeloma. Stable compared to 2020.   No results found.    ASSESSMENT:  1.  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  Lambda light chains 303, ratio of 0.03.  LDH  normal.  Beta-2 microglobulin 1.7.  Serum viscosity normal.  Hemoglobin, calcium and creatinine normal. -Bone marrow biopsy on 04/30/2019 showed atypical plasma cells representing 12% in the aspirate.  Plasma cells display lambda light chain restriction. -Myeloma FISH panel shows gain of 1 q.  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 -24-hour urine negative for immunofixation and UPEP.  Urine total protein was undetectable. -Skeletal survey on 05/09/2019 did not show any evidence of lytic lesions.  Insurance refused PET CT scan. -MRI of the thoracic, lumbar spine and pelvis with and without contrast on 06/18/2019 did not show any evidence of myeloma. -Based on Mayo 2018 risk stratification system, his M spike is more than 2 g/dL.  Involved/uninvolved free light chain ratio was more than 20.  Bone marrow plasma cells were less than 20%.  With 2 risk factors, he was considered high risk with estimated median time to progression of 29 months, estimated risk of progression of 24 %/year during first 2 years, 11 %/year for the next 3 years, 3 %/year for the next 5 years.   2.  CAD: -Status post CABG at [facility] in December 2020.   PLAN:  1.  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 Creatinine and calcium are stable and normal - M spike is stable at 1.7 - hemoglobin normal at 14.2 - Lambda light chains are 266, stable. Kappa lambda light chain ratio is 0.04 and stable.  - skeletal survey from 08/18/20 was negative for lytic lesions.  - Labs overall stable and he is clinically asymptomatic. Recommend continued surveillance with labs and follow up every 6 months. Plan to repeat skeletal survey annually. We reviewed concerning symptoms that would warrant him to return to clinic sooner.   Orders placed this encounter:  No orders of the defined types were placed in this encounter.     Return to clinic  6 months- bone survey, labs (spep, ldh, light chains,  cbc, cmp), MD   I discussed the assessment  and treatment plan with the patient. The patient was provided an opportunity to ask questions and all were answered. The patient agreed with the plan and demonstrated an understanding of the instructions.   The patient was advised to call back or seek an in-person evaluation if the symptoms worsen or if the condition fails to improve as anticipated.   I spent 20 minutes face-to-face video visit time dedicated to the care of this patient on the date of this encounter to include pre-visit review of medical oncology notes, labs, imaging, face-to-face time with the patient, and post visit ordering of testing/documentation.   Beckey Rutter, DNP, AGNP-C [place] [phone]

## 2021-02-24 NOTE — Patient Instructions (Signed)
[place] at [facility] Discharge Instructions  You were seen today by Beckey Rutter, NP. She discussed your recent lab work and everything is stable.  Please follow up as scheduled.   Thank you for choosing [place] at [facility] to provide your oncology and hematology care.  To afford each patient quality time with our provider, please arrive at least 15 minutes before your scheduled appointment time.   If you have a lab appointment with the [facility] please come in thru the Main Entrance and check in at the main information desk.  You need to re-schedule your appointment should you arrive 10 or more minutes late.  We strive to give you quality time with our providers, and arriving late affects you and other patients whose appointments are after yours.  Also, if you no show three or more times for appointments you may be dismissed from the clinic at the providers discretion.     Again, thank you for choosing [facility].  Our hope is that these requests will decrease the amount of time that you wait before being seen by our physicians.       _____________________________________________________________  Should you have questions after your visit to [facility], please contact our office at [phone] and follow the prompts.  Our office hours are 8:00 a.m. and 4:30 p.m. Monday - Friday.  Please note that voicemails left after 4:00 p.m. may not be returned until the following business day.  We are closed weekends and major holidays.  You do have access to a nurse 24-7, just call the main number to the clinic [phone] and do not press any options, hold on the line and a nurse will answer the phone.    For prescription refill requests, have your pharmacy contact our office and allow 72 hours.    Due to Covid, you will need to wear a mask upon entering the hospital. If you do not have a mask, a mask will be given  to you at the Main Entrance upon arrival. For doctor visits, patients may have 1 support person age 61 or older with them. For treatment visits, patients can not have anyone with them due to social distancing guidelines and our immunocompromised population.

## 2021-03-08 ENCOUNTER — Encounter: Payer: Self-pay | Admitting: Physician Assistant

## 2021-03-08 ENCOUNTER — Other Ambulatory Visit: Payer: Self-pay

## 2021-03-08 ENCOUNTER — Ambulatory Visit (INDEPENDENT_AMBULATORY_CARE_PROVIDER_SITE_OTHER): Payer: 59 | Admitting: Physician Assistant

## 2021-03-08 VITALS — BP 142/84 | HR 66 | Temp 97.8°F | Ht 67.0 in | Wt 196.4 lb

## 2021-03-08 DIAGNOSIS — Z1211 Encounter for screening for malignant neoplasm of colon: Secondary | ICD-10-CM

## 2021-03-08 DIAGNOSIS — Z0000 Encounter for general adult medical examination without abnormal findings: Secondary | ICD-10-CM | POA: Diagnosis not present

## 2021-03-08 NOTE — Progress Notes (Signed)
Established Patient Office Visit  Subjective:  Patient ID: Walter Perez, male    DOB: 1955-07-07  Age: 65 y.o. MRN: [id number]  CC:  Chief Complaint  Patient presents with   Follow-up     HPI Walter Perez presents for CPE.  Acute concerns: No concerns   Health maintenance: Lifestyle/ exercise: Walks about 2 miles daily Nutrition: Cooks at home, does well he says  Mental health: No concerns  Caffeine: Coffee occasionally, not regular  Sleep: No issues  Substance use: None  Sexual activity: Not regularly  Immunizations: Needs Shingrix  Colonoscopy: Cologuard 3 years ago   Specialists: Dr. Denna Haggard - Dermatologist Dr. Harrington Challenger - Cardiologist Dr. Delton Coombes - Oncologist     Past Medical History:  Diagnosis Date   Coronary artery disease    Hyperlipidemia    Hypertension    Myocardial infarction ([place]) 06/19/2019    Past Surgical History:  Procedure Laterality Date   CORONARY ARTERY BYPASS GRAFT  06/19/2019   quadruple    Family History  Problem Relation Age of Onset   Stroke Mother    Stroke Father    Kidney disease Brother    Alzheimer's disease Sister     Social History   Socioeconomic History   Marital status: Married    Spouse name: Not on file   Number of children: 2   Years of education: Not on file   Highest education level: Not on file  Occupational History   Occupation: Unemployed  Tobacco Use   Smoking status: Never   Smokeless tobacco: Never  Substance and Sexual Activity   Alcohol use: Never   Drug use: Never   Sexual activity: Yes  Other Topics Concern   Not on file  Social History Narrative   Not on file   Social Determinants of Health   Financial Resource Strain: Not on file  Food Insecurity: Not on file  Transportation Needs: Not on file  Physical Activity: Not on file  Stress: Not on file  Social Connections: Not on file  Intimate Partner Violence: Not At Risk   Fear of Current or Ex-Partner: No   Emotionally Abused: No    Physically Abused: No   Sexually Abused: No    Outpatient Medications Prior to Visit  Medication Sig Dispense Refill   aspirin 81 MG EC tablet Take 81 mg by mouth daily.      atorvastatin (LIPITOR) 80 MG tablet Take 1 tablet (80 mg total) by mouth daily at 6 PM. 90 tablet 1   clopidogrel (PLAVIX) 75 MG tablet Take 1 tablet (75 mg total) by mouth daily. 90 tablet 1   metoprolol succinate (TOPROL-XL) 100 MG 24 hr tablet Take 1 tablet (100 mg total) by mouth daily. Take with or immediately following a meal. 90 tablet 3   nitroGLYCERIN (NITROSTAT) 0.4 MG SL tablet      olmesartan-hydrochlorothiazide (BENICAR HCT) 40-25 MG tablet Take 1 tablet by mouth daily. 90 tablet 1   triamcinolone cream (KENALOG) 0.1 % Apply 1 application topically daily as needed. 453 g 3   Wheat Dextrin (BENEFIBER DRINK MIX) PACK Please mix per packaging instructions daily to increase fiber and help with constipation. 24 each 5   sulfamethoxazole-trimethoprim (BACTRIM DS) 800-160 MG tablet Take 1 tablet by mouth 2 (two) times daily. 14 tablet 0   Facility-Administered Medications Prior to Visit  Medication Dose Route Frequency Provider Last Rate Last Admin   triamcinolone acetonide (KENALOG-40) injection 40 mg  40 mg Intramuscular Once Tafeen,  Tressie Ellis, MD        No Known Allergies  ROS Review of Systems  Constitutional:  Negative for activity change, appetite change and unexpected weight change.  HENT:  Negative for congestion.   Eyes:  Negative for visual disturbance.  Respiratory:  Negative for apnea and shortness of breath.   Cardiovascular:  Negative for chest pain.  Gastrointestinal:  Negative for abdominal pain and blood in stool.  Endocrine: Negative for polydipsia, polyphagia and polyuria.  Genitourinary:  Negative for difficulty urinating.  Musculoskeletal:  Negative for arthralgias.  Skin:  Negative for rash.  Neurological:  Negative for seizures, weakness and headaches.  Psychiatric/Behavioral:   Negative for sleep disturbance and suicidal ideas.      Objective:    Physical Exam Vitals and nursing note reviewed.  Constitutional:      Appearance: Normal appearance.  HENT:     Head: Normocephalic and atraumatic.     Right Ear: External ear normal.     Left Ear: External ear normal.     Nose: Nose normal.     Mouth/Throat:     Mouth: Mucous membranes are moist.  Eyes:     Extraocular Movements: Extraocular movements intact.     Conjunctiva/sclera: Conjunctivae normal.     Pupils: Pupils are equal, round, and reactive to light.  Cardiovascular:     Rate and Rhythm: Normal rate and regular rhythm.     Pulses: Normal pulses.     Heart sounds: Normal heart sounds. No murmur heard. Pulmonary:     Effort: Pulmonary effort is normal.     Breath sounds: Normal breath sounds.  Abdominal:     General: Abdomen is flat. Bowel sounds are normal.     Palpations: Abdomen is soft.  Musculoskeletal:        General: Normal range of motion.  Skin:    General: Skin is warm and dry.     Comments: Left temple - 1 cm large black raised warty lesion   Neurological:     General: No focal deficit present.     Mental Status: He is alert and oriented to person, place, and time.  Psychiatric:        Mood and Affect: Mood normal.        Behavior: Behavior normal.    BP (!) 142/84   Pulse 66   Temp 97.8 F (36.6 C)   Ht '5\' 7"'$  (1.702 m)   Wt 196 lb 6.1 oz (89.1 kg)   SpO2 98%   BMI 30.76 kg/m  Wt Readings from Last 3 Encounters:  03/08/21 196 lb 6.1 oz (89.1 kg)  02/24/21 193 lb (87.5 kg)  12/08/20 195 lb 12.8 oz (88.8 kg)     Health Maintenance Due  Topic Date Due   Pneumococcal Vaccine 43-63 Years old (1 - PCV) Never done   HIV Screening  Never done   Zoster Vaccines- Shingrix (1 of 2) Never done   Fecal DNA (Cologuard)  Never done   COVID-19 Vaccine (4 - Booster for Pfizer series) 07/30/2020   INFLUENZA VACCINE  01/31/2021    There are no preventive care reminders to  display for this patient.  No results found for: TSH Lab Results  Component Value Date   WBC 5.6 02/18/2021   HGB 14.2 02/18/2021   HCT 42.6 02/18/2021   MCV 88.8 02/18/2021   PLT 161 02/18/2021   Lab Results  Component Value Date   NA 134 (L) 02/18/2021   K 3.5 02/18/2021  CO2 30 02/18/2021   GLUCOSE 106 (H) 02/18/2021   BUN 14 02/18/2021   CREATININE 1.09 02/18/2021   BILITOT 1.1 02/18/2021   ALKPHOS 59 02/18/2021   AST 21 02/18/2021   ALT 23 02/18/2021   PROT 8.0 02/18/2021   ALBUMIN 4.1 02/18/2021   CALCIUM 8.9 02/18/2021   ANIONGAP 6 02/18/2021   GFR 57.09 (L) 12/08/2020   Lab Results  Component Value Date   CHOL 131 08/06/2020   Lab Results  Component Value Date   HDL 40 08/06/2020   Lab Results  Component Value Date   LDLCALC 66 08/06/2020   Lab Results  Component Value Date   TRIG 140 08/06/2020   Lab Results  Component Value Date   CHOLHDL 3.3 08/06/2020   Lab Results  Component Value Date   HGBA1C 5.9 (H) 08/06/2020      Assessment & Plan:   Problem List Items Addressed This Visit   None Visit Diagnoses     Encounter for annual physical exam    -  Primary   Relevant Orders   Cologuard   Screening for colon cancer       Relevant Orders   Cologuard      Age-appropriate screening and counseling performed today. He is UTD with his labs. Preventive measures discussed and printed in AVS for patient. He has an appointment with dermatology next week for lesion removal. I have ordered Cologuard to be updated this year. Discussed need for Shingrix. He needs to have vision updated, but he is good with dental exams.   Follow-up: No follow-ups on file.    Chad Donoghue M [place], PA-C

## 2021-03-08 NOTE — Patient Instructions (Addendum)
Good to see you again. Continue your regular medication regimen. Great work with exercise and healthy diet!  Please talk with your pharmacy about getting the Shingrix vaccine for shingles.  I have ordered Cologuard for your. Keep up to date with vision and dental exams.   See you back in 1 year. Call sooner if any problems.

## 2021-03-21 LAB — COLOGUARD: Cologuard: NEGATIVE

## 2021-03-29 ENCOUNTER — Encounter: Payer: Self-pay | Admitting: Dermatology

## 2021-03-29 ENCOUNTER — Other Ambulatory Visit: Payer: Self-pay

## 2021-03-29 ENCOUNTER — Ambulatory Visit (INDEPENDENT_AMBULATORY_CARE_PROVIDER_SITE_OTHER): Payer: 59 | Admitting: Dermatology

## 2021-03-29 DIAGNOSIS — L910 Hypertrophic scar: Secondary | ICD-10-CM

## 2021-03-29 DIAGNOSIS — L820 Inflamed seborrheic keratosis: Secondary | ICD-10-CM

## 2021-03-29 DIAGNOSIS — L309 Dermatitis, unspecified: Secondary | ICD-10-CM

## 2021-03-29 DIAGNOSIS — D485 Neoplasm of uncertain behavior of skin: Secondary | ICD-10-CM

## 2021-03-29 MED ORDER — TRIAMCINOLONE ACETONIDE 40 MG/ML IJ SUSP
40.0000 mg | Freq: Once | INTRAMUSCULAR | Status: AC
  Administered 2021-03-29: 40 mg

## 2021-03-29 MED ORDER — TRIAMCINOLONE ACETONIDE 0.1 % EX CREA: 1.0000 "application " | TOPICAL_CREAM | Freq: Every day | CUTANEOUS | 3 refills | Status: AC | PRN

## 2021-03-29 NOTE — Patient Instructions (Signed)

## 2021-04-15 ENCOUNTER — Encounter: Payer: Self-pay | Admitting: Dermatology

## 2021-04-15 NOTE — Progress Notes (Signed)
New Patient   Subjective  Walter Perez is a 65 y.o. male who presents for the following: Skin Problem (Wants spot removed on left temple & needs something or scaly spot between eyes).  General skin check, several areas of concern including growth on left temple and scar on chest Location:  Duration:  Quality:  Associated Signs/Symptoms: Modifying Factors:  Severity:  Timing: Context:    The following portions of the chart were reviewed this encounter and updated as appropriate:  Tobacco  Allergies  Meds  Problems  Med Hx  Surg Hx  Fam Hx      Objective  Well appearing patient in no apparent distress; mood and affect are within normal limits. Chest - Medial (Center) Symptomatic 1 cm keloid  Left Temple Pink 8 mm crust, dermoscopy favors I SK over SCCA.         All skin waist up examined.   Assessment & Plan  Keloid Chest - Medial (Center)  triamcinolone acetonide (KENALOG-40) injection 40 mg - Chest - Medial (Center)   Intralesional injection - Chest - Medial (Center) Location: chest  Informed Consent: Discussed risks (infection, pain, bleeding, bruising, thinning of the skin, loss of skin pigment, lack of resolution, and recurrence of lesion) and benefits of the procedure, as well as the alternatives. Informed consent was obtained. Preparation: The area was prepared a standard fashion.  Procedure Details: An intralesional injection was performed with Kenalog 40 mg/cc. 0.3 cc in total were injected.  Total number of injections: 3  Plan: The patient was instructed on post-op care. Recommend OTC analgesia as needed for pain.   Related Medications triamcinolone acetonide (KENALOG-40) injection 40 mg   Dermatitis Left Forearm - Anterior; Right Forearm - Anterior; Left Lower Leg - Anterior; Right Lower Leg - Anterior; Glabella  Tac cream not for face- use over the counter hydrocortisone ointment   triamcinolone cream (KENALOG) 0.1 % - Left  Forearm - Anterior, Left Lower Leg - Anterior, Right Forearm - Anterior, Right Lower Leg - Anterior Apply 1 application topically daily as needed.  Neoplasm of uncertain behavior of skin Left Temple  Skin / nail biopsy Type of biopsy: tangential   Informed consent: discussed and consent obtained   Timeout: patient name, date of birth, surgical site, and procedure verified   Procedure prep:  Patient was prepped and draped in usual sterile fashion (Non sterile) Prep type:  Chlorhexidine Anesthesia: the lesion was anesthetized in a standard fashion   Anesthetic:  1% lidocaine w/ epinephrine 1-100,000 local infiltration Instrument used: flexible razor blade   Hemostasis achieved with: ferric subsulfate   Outcome: patient tolerated procedure well   Post-procedure details: wound care instructions given    Specimen 1 - Surgical pathology Differential Diagnosis: r/o atypia  Check Margins: No   All skin waist up examined.   Assessment & Plan    Keloid Chest - Medial (Center)  triamcinolone acetonide (KENALOG-40) injection 40 mg - Chest - Medial (Center)   Intralesional injection - Chest - Medial (Center) Location: chest  Informed Consent: Discussed risks (infection, pain, bleeding, bruising, thinning of the skin, loss of skin pigment, lack of resolution, and recurrence of lesion) and benefits of the procedure, as well as the alternatives. Informed consent was obtained. Preparation: The area was prepared a standard fashion.  Procedure Details: An intralesional injection was performed with Kenalog 40 mg/cc. 0.3 cc in total were injected.  Total number of injections: 3  Plan: The patient was instructed on post-op care. Recommend  OTC analgesia as needed for pain.   Related Medications triamcinolone acetonide (KENALOG-40) injection 40 mg   Dermatitis Left Forearm - Anterior; Right Forearm - Anterior; Left Lower Leg - Anterior; Right Lower Leg - Anterior; Glabella  Tac cream  not for face- use over the counter hydrocortisone ointment   triamcinolone cream (KENALOG) 0.1 % - Left Forearm - Anterior, Left Lower Leg - Anterior, Right Forearm - Anterior, Right Lower Leg - Anterior Apply 1 application topically daily as needed.  Neoplasm of uncertain behavior of skin Left Temple  Skin / nail biopsy Type of biopsy: tangential   Informed consent: discussed and consent obtained   Timeout: patient name, date of birth, surgical site, and procedure verified   Procedure prep:  Patient was prepped and draped in usual sterile fashion (Non sterile) Prep type:  Chlorhexidine Anesthesia: the lesion was anesthetized in a standard fashion   Anesthetic:  1% lidocaine w/ epinephrine 1-100,000 local infiltration Instrument used: flexible razor blade   Hemostasis achieved with: ferric subsulfate   Outcome: patient tolerated procedure well   Post-procedure details: wound care instructions given    Specimen 1 - Surgical pathology Differential Diagnosis: r/o atypia  Check Margins: No      I, Lavonna Monarch, MD, have reviewed all documentation for this visit.  The documentation on 04/15/21 for the exam, diagnosis, procedures, and orders are all accurate and complete.

## 2021-04-28 ENCOUNTER — Other Ambulatory Visit: Payer: Self-pay | Admitting: Internal Medicine

## 2021-05-28 ENCOUNTER — Other Ambulatory Visit: Payer: Self-pay | Admitting: Family Medicine

## 2021-06-29 ENCOUNTER — Ambulatory Visit (INDEPENDENT_AMBULATORY_CARE_PROVIDER_SITE_OTHER): Payer: 59 | Admitting: Dermatology

## 2021-06-29 ENCOUNTER — Encounter: Payer: Self-pay | Admitting: Dermatology

## 2021-06-29 ENCOUNTER — Other Ambulatory Visit: Payer: Self-pay

## 2021-06-29 DIAGNOSIS — L910 Hypertrophic scar: Secondary | ICD-10-CM | POA: Diagnosis not present

## 2021-06-29 MED ORDER — TRIAMCINOLONE ACETONIDE 40 MG/ML IJ SUSP
40.0000 mg | Freq: Once | INTRAMUSCULAR | Status: AC
  Administered 2021-06-29: 16:00:00 40 mg

## 2021-06-29 NOTE — Progress Notes (Signed)
Tiamcinolone 40 mg   [id number]

## 2021-07-28 ENCOUNTER — Telehealth: Payer: Self-pay | Admitting: Internal Medicine

## 2021-07-28 NOTE — Telephone Encounter (Signed)
° °*  STAT* If patient is at the pharmacy, call can be transferred to refill team.   1. Which medications need to be refilled? (please list name of each medication and dose if known)   metoprolol succinate (TOPROL-XL) 100 MG 24 hr tablet  2. Which pharmacy/location (including street and city if local pharmacy) is medication to be sent to?  [place], [place] - [address]  3. Do they need a 30 day or 90 day supply? 90 days

## 2021-07-29 ENCOUNTER — Encounter: Payer: Self-pay | Admitting: Dermatology

## 2021-07-29 ENCOUNTER — Other Ambulatory Visit: Payer: Self-pay | Admitting: *Deleted

## 2021-07-29 MED ORDER — METOPROLOL SUCCINATE ER 100 MG PO TB24: ORAL_TABLET | ORAL | 2 refills | Status: DC

## 2021-07-29 NOTE — Progress Notes (Signed)
° °  Follow-Up Visit   Subjective  Walter Perez is a 66 y.o. male who presents for the following: Keloid (Injection on chest- better not as itchy).  Keloid chest Location:  Duration:  Quality:  Associated Signs/Symptoms: Modifying Factors:  Severity:  Timing: Context:   Objective  Well appearing patient in no apparent distress; mood and affect are within normal limits. Chest - Medial [facility]) Perhaps 50% improvement in the elevation and thickness of his chest keloid, reduction but not clearance of symptoms so he would like re-injection.    All skin waist up examined.   Assessment & Plan    Keloid Chest - Medial (Center)  Intralesional injection - Chest - Medial [facility]) Location: chest  Informed Consent: Discussed risks (infection, pain, bleeding, bruising, thinning of the skin, loss of skin pigment,  Indentation, lack of resolution, and recurrence of lesion) and benefits of the procedure, as well as the alternatives. Informed consent was obtained. Preparation: The area was prepared in a standard fashion.   Procedure Details: An intralesional injection was performed with Kenalog 40 mg/cc. 0.3 cc in total were injected.  Total number of injections: 4  Plan: The patient was instructed on post-op care. Recommend OTC analgesia as needed for pain.   triamcinolone acetonide (KENALOG-40) injection 40 mg - Chest - Medial (Center)   Related Medications triamcinolone acetonide (KENALOG-40) injection 40 mg       I, Lavonna Monarch, MD, have reviewed all documentation for this visit.  The documentation on 07/29/21 for the exam, diagnosis, procedures, and orders are all accurate and complete.

## 2021-08-19 ENCOUNTER — Other Ambulatory Visit (HOSPITAL_COMMUNITY): Payer: Self-pay

## 2021-08-19 DIAGNOSIS — D472 Monoclonal gammopathy: Secondary | ICD-10-CM

## 2021-08-22 ENCOUNTER — Inpatient Hospital Stay (HOSPITAL_COMMUNITY): Payer: 59 | Attending: Hematology

## 2021-08-22 DIAGNOSIS — I10 Essential (primary) hypertension: Secondary | ICD-10-CM | POA: Diagnosis not present

## 2021-08-22 DIAGNOSIS — D472 Monoclonal gammopathy: Secondary | ICD-10-CM | POA: Diagnosis present

## 2021-08-22 DIAGNOSIS — I2510 Atherosclerotic heart disease of native coronary artery without angina pectoris: Secondary | ICD-10-CM | POA: Insufficient documentation

## 2021-08-22 LAB — CBC WITH DIFFERENTIAL/PLATELET
Abs Immature Granulocytes: 0.02 10*3/uL (ref 0.00–0.07)
Basophils Absolute: 0 10*3/uL (ref 0.0–0.1)
Basophils Relative: 1 %
Eosinophils Absolute: 0.3 10*3/uL (ref 0.0–0.5)
Eosinophils Relative: 5 %
HCT: 44.4 % (ref 39.0–52.0)
Hemoglobin: 14.5 g/dL (ref 13.0–17.0)
Immature Granulocytes: 0 %
Lymphocytes Relative: 20 %
Lymphs Abs: 1.2 10*3/uL (ref 0.7–4.0)
MCH: 28.4 pg (ref 26.0–34.0)
MCHC: 32.7 g/dL (ref 30.0–36.0)
MCV: 86.9 fL (ref 80.0–100.0)
Monocytes Absolute: 0.4 10*3/uL (ref 0.1–1.0)
Monocytes Relative: 7 %
Neutro Abs: 4 10*3/uL (ref 1.7–7.7)
Neutrophils Relative %: 67 %
Platelets: 181 10*3/uL (ref 150–400)
RBC: 5.11 MIL/uL (ref 4.22–5.81)
RDW: 12.6 % (ref 11.5–15.5)
WBC: 6.1 10*3/uL (ref 4.0–10.5)
nRBC: 0 % (ref 0.0–0.2)

## 2021-08-22 LAB — COMPREHENSIVE METABOLIC PANEL
ALT: 24 U/L (ref 0–44)
AST: 22 U/L (ref 15–41)
Albumin: 4.1 g/dL (ref 3.5–5.0)
Alkaline Phosphatase: 57 U/L (ref 38–126)
Anion gap: 7 (ref 5–15)
BUN: 18 mg/dL (ref 8–23)
CO2: 30 mmol/L (ref 22–32)
Calcium: 8.8 mg/dL — ABNORMAL LOW (ref 8.9–10.3)
Chloride: 99 mmol/L (ref 98–111)
Creatinine, Ser: 1 mg/dL (ref 0.61–1.24)
GFR, Estimated: >60 mL/min (ref >60)
Glucose, Bld: 111 mg/dL — ABNORMAL HIGH (ref 70–99)
Potassium: 3.3 mmol/L — ABNORMAL LOW (ref 3.5–5.1)
Sodium: 136 mmol/L (ref 135–145)
Total Bilirubin: 0.8 mg/dL (ref 0.3–1.2)
Total Protein: 7.8 g/dL (ref 6.5–8.1)

## 2021-08-22 LAB — LACTATE DEHYDROGENASE: LDH: 157 U/L (ref 98–192)

## 2021-08-23 LAB — KAPPA/LAMBDA LIGHT CHAINS
Kappa free light chain: 10.9 mg/L (ref 3.3–19.4)
Kappa, lambda light chain ratio: 0.05 — ABNORMAL LOW (ref 0.26–1.65)
Lambda free light chains: 227.1 mg/L — ABNORMAL HIGH (ref 5.7–26.3)

## 2021-08-24 ENCOUNTER — Other Ambulatory Visit: Payer: Self-pay | Admitting: Physician Assistant

## 2021-08-24 LAB — PROTEIN ELECTROPHORESIS, SERUM
A/G Ratio: 1.1 (ref 0.7–1.7)
Albumin ELP: 4 g/dL (ref 2.9–4.4)
Alpha-1-Globulin: 0.2 g/dL (ref 0.0–0.4)
Alpha-2-Globulin: 0.6 g/dL (ref 0.4–1.0)
Beta Globulin: 0.8 g/dL (ref 0.7–1.3)
Gamma Globulin: 2 g/dL — ABNORMAL HIGH (ref 0.4–1.8)
Globulin, Total: 3.7 g/dL (ref 2.2–3.9)
M-Spike, %: 1.6 g/dL — ABNORMAL HIGH
Total Protein ELP: 7.7 g/dL (ref 6.0–8.5)

## 2021-08-29 ENCOUNTER — Other Ambulatory Visit: Payer: Self-pay

## 2021-08-29 ENCOUNTER — Ambulatory Visit (HOSPITAL_COMMUNITY)
Admission: RE | Admit: 2021-08-29 | Discharge: 2021-08-29 | Disposition: A | Discharge status: Home / Self Care | Payer: 59 | Source: Ambulatory Visit | Attending: Nurse Practitioner | Admitting: Nurse Practitioner

## 2021-08-29 ENCOUNTER — Inpatient Hospital Stay (HOSPITAL_BASED_OUTPATIENT_CLINIC_OR_DEPARTMENT_OTHER): Payer: 59 | Admitting: Hematology

## 2021-08-29 VITALS — BP 142/94 | HR 65 | Temp 96.8°F | Resp 18 | Ht 67.0 in | Wt 192.6 lb

## 2021-08-29 DIAGNOSIS — D472 Monoclonal gammopathy: Secondary | ICD-10-CM

## 2021-08-29 NOTE — Progress Notes (Signed)
[place], [place] [id number]   CLINIC:  Medical Oncology/Hematology  PCP:  Fredirick Lathe, PA-C [place] / [place] [address]  [phone]  REASON FOR VISIT:  Follow-up for smoldering myeloma  PRIOR THERAPY: none  CURRENT THERAPY: surveillance  INTERVAL HISTORY:  Mr. Walter Perez, a 66 y.o. male, returns for routine follow-up for his smoldering myeloma. Walter Perez was last seen on 08/25/2020.  Today he reports feeling good. He denies new pains and recent infections.   REVIEW OF SYSTEMS:  Review of Systems  Constitutional:  Negative for appetite change and fatigue.  Genitourinary:  Positive for frequency.   Neurological:  Positive for numbness.  Psychiatric/Behavioral:  Positive for sleep disturbance.   All other systems reviewed and are negative.  PAST MEDICAL/SURGICAL HISTORY:  Past Medical History:  Diagnosis Date   Coronary artery disease    Hyperlipidemia    Hypertension    Myocardial infarction ([place]) 06/19/2019   Past Surgical History:  Procedure Laterality Date   CORONARY ARTERY BYPASS GRAFT  06/19/2019   quadruple    SOCIAL HISTORY:  Social History   Socioeconomic History   Marital status: Married    Spouse name: Not on file   Number of children: 2   Years of education: Not on file   Highest education level: Not on file  Occupational History   Occupation: Unemployed  Tobacco Use   Smoking status: Never   Smokeless tobacco: Never  Substance and Sexual Activity   Alcohol use: Never   Drug use: Never   Sexual activity: Yes  Other Topics Concern   Not on file  Social History Narrative   Not on file   Social Determinants of Health   Financial Resource Strain: Not on file  Food Insecurity: Not on file  Transportation Needs: Not on file  Physical Activity: Not on file  Stress: Not on file  Social Connections: Not on file  Intimate Partner Violence: Not on file    FAMILY HISTORY:  Family  History  Problem Relation Age of Onset   Stroke Mother    Stroke Father    Kidney disease Brother    Alzheimer's disease Sister     CURRENT MEDICATIONS:  Current Outpatient Medications  Medication Sig Dispense Refill   aspirin 81 MG EC tablet Take 81 mg by mouth daily.      atorvastatin (LIPITOR) 80 MG tablet TAKE 1 TABLET BY MOUTH ONCE DAILY AT  6PM 90 tablet 0   clopidogrel (PLAVIX) 75 MG tablet Take 1 tablet by mouth once daily 90 tablet 0   metoprolol succinate (TOPROL-XL) 100 MG 24 hr tablet TAKE 1 TABLET BY MOUTH ONCE DAILY TAKE  WITH  OR  IMMEDIATELY  FOLLOWING  A  MEAL 90 tablet 2   nitroGLYCERIN (NITROSTAT) 0.4 MG SL tablet      olmesartan-hydrochlorothiazide (BENICAR HCT) 40-25 MG tablet Take 1 tablet by mouth once daily 90 tablet 0   triamcinolone cream (KENALOG) 0.1 % Apply 1 application topically daily as needed. 453 g 3   Wheat Dextrin (BENEFIBER DRINK MIX) PACK Please mix per packaging instructions daily to increase fiber and help with constipation. 24 each 5   Current Facility-Administered Medications  Medication Dose Route Frequency Provider Last Rate Last Admin   triamcinolone acetonide (KENALOG-40) injection 40 mg  40 mg Intramuscular Once Lavonna Monarch, MD        ALLERGIES:  No Known Allergies  PHYSICAL EXAM:  Performance status (  ECOG): 0 - Asymptomatic  There were no vitals filed for this visit. Wt Readings from Last 3 Encounters:  03/08/21 196 lb 6.1 oz (89.1 kg)  02/24/21 193 lb (87.5 kg)  12/08/20 195 lb 12.8 oz (88.8 kg)   Physical Exam Vitals reviewed.  Constitutional:      Appearance: Normal appearance.  Cardiovascular:     Rate and Rhythm: Normal rate and regular rhythm.     Pulses: Normal pulses.     Heart sounds: Normal heart sounds.  Pulmonary:     Effort: Pulmonary effort is normal.     Breath sounds: Normal breath sounds.  Neurological:     General: No focal deficit present.     Mental Status: He is alert and oriented to person,  place, and time.  Psychiatric:        Mood and Affect: Mood normal.        Behavior: Behavior normal.    LABORATORY DATA:  I have reviewed the labs as listed.  CBC Latest Ref Rng & Units 08/22/2021 02/18/2021 12/08/2020  WBC 4.0 - 10.5 K/uL 6.1 5.6 9.3  Hemoglobin 13.0 - 17.0 g/dL 14.5 14.2 14.0  Hematocrit 39.0 - 52.0 % 44.4 42.6 40.6  Platelets 150 - 400 K/uL 181 161 166.0   CMP Latest Ref Rng & Units 08/22/2021 02/18/2021 12/08/2020  Glucose 70 - 99 mg/dL 111(H) 106(H) 100(H)  BUN 8 - 23 mg/dL 18 14 17   Creatinine 0.61 - 1.24 mg/dL 1.00 1.09 1.33  Sodium 135 - 145 mmol/L 136 134(L) 132(L)  Potassium 3.5 - 5.1 mmol/L 3.3(L) 3.5 3.0(L)  Chloride 98 - 111 mmol/L 99 98 93(L)  CO2 22 - 32 mmol/L 30 30 28   Calcium 8.9 - 10.3 mg/dL 8.8(L) 8.9 8.7  Total Protein 6.5 - 8.1 g/dL 7.8 8.0 -  Total Bilirubin 0.3 - 1.2 mg/dL 0.8 1.1 -  Alkaline Phos 38 - 126 U/L 57 59 -  AST 15 - 41 U/L 22 21 -  ALT 0 - 44 U/L 24 23 -      Component Value Date/Time   RBC 5.11 08/22/2021 0945   MCV 86.9 08/22/2021 0945   MCV 86 08/06/2020 1607   MCH 28.4 08/22/2021 0945   MCHC 32.7 08/22/2021 0945   RDW 12.6 08/22/2021 0945   RDW 13.1 08/06/2020 1607   LYMPHSABS 1.2 08/22/2021 0945   MONOABS 0.4 08/22/2021 0945   EOSABS 0.3 08/22/2021 0945   BASOSABS 0.0 08/22/2021 0945    DIAGNOSTIC IMAGING:  I have independently reviewed the scans and discussed with the patient. No results found.   ASSESSMENT:  1.  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  Lambda light chains 303, ratio of 0.03.  LDH normal.  Beta-2 microglobulin 1.7.  Serum viscosity normal.  Hemoglobin, calcium and creatinine normal. -Bone marrow biopsy on 04/30/2019 showed atypical plasma cells representing 12% in the aspirate.  Plasma cells display lambda light chain restriction. -Myeloma FISH panel shows gain of 1 q.  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 -24-hour urine negative for immunofixation  and UPEP.  Urine total protein was undetectable. -Skeletal survey on 05/09/2019 did not show any evidence of lytic lesions.  Insurance refused PET CT scan. -MRI of the thoracic, lumbar spine and pelvis with and without contrast on 06/18/2019 did not show any evidence of myeloma. -Based on Mayo 2018 risk stratification system, his M spike is more than 2 g/dL.  Involved/uninvolved free light chain ratio  was more than 20.  Bone marrow plasma cells were less than 20%.  With 2 risk factors, he was considered high risk with estimated median time to progression of 29 months, estimated risk of progression of 24 %/year during first 2 years, 11 %/year for the next 3 years, 3 %/year for the next 5 years.   2.  CAD: -Status post CABG at [facility] in December 2020.   PLAN:  1.  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  No B symptoms or infections. - Reviewed labs from 08/22/2021 which showed normal creatinine and calcium.  LFTs are normal.  M spike is stable at 1.6 g.  Hemoglobin is normal.  Lambda light chains at 227 with ratio of 0.05 and is stable. - No "crab" features at this time. - I have recommended him to do a skeletal survey today.  We will follow-up on the results. - RTC 6 months for follow-up with repeat labs.  Orders placed this encounter:  No orders of the defined types were placed in this encounter.    Derek Jack, MD [place] [phone]   I, Thana Ates, am acting as a scribe for Dr. Derek Jack.  I, Derek Jack MD, have reviewed the above documentation for accuracy and completeness, and I agree with the above.

## 2021-08-29 NOTE — Patient Instructions (Signed)
[place] at [facility] Main Discharge Instructions   You were seen and examined today by Dr. Delton Coombes.  He reviewed your lab work and xrays which are normal/stable.  Return as scheduled in 6 months.    Thank you for choosing [place] at [facility] to provide your oncology and hematology care.  To afford each patient quality time with our provider, please arrive at least 15 minutes before your scheduled appointment time.   If you have a lab appointment with the [facility] please come in thru the Main Entrance and check in at the main information desk.  You need to re-schedule your appointment should you arrive 10 or more minutes late.  We strive to give you quality time with our providers, and arriving late affects you and other patients whose appointments are after yours.  Also, if you no show three or more times for appointments you may be dismissed from the clinic at the providers discretion.     Again, thank you for choosing [facility].  Our hope is that these requests will decrease the amount of time that you wait before being seen by our physicians.       _____________________________________________________________  Should you have questions after your visit to [facility], please contact our office at [phone] and follow the prompts.  Our office hours are 8:00 a.m. and 4:30 p.m. Monday - Friday.  Please note that voicemails left after 4:00 p.m. may not be returned until the following business day.  We are closed weekends and major holidays.  You do have access to a nurse 24-7, just call the main number to the clinic [phone] and do not press any options, hold on the line and a nurse will answer the phone.    For prescription refill requests, have your pharmacy contact our office and allow 72 hours.    Due to Covid, you will need to wear a mask upon entering the hospital. If you do not have a mask, a  mask will be given to you at the Main Entrance upon arrival. For doctor visits, patients may have 1 support person age 46 or older with them. For treatment visits, patients can not have anyone with them due to social distancing guidelines and our immunocompromised population.

## 2021-09-28 ENCOUNTER — Encounter: Payer: Self-pay | Admitting: Family Medicine

## 2021-09-28 ENCOUNTER — Ambulatory Visit (INDEPENDENT_AMBULATORY_CARE_PROVIDER_SITE_OTHER): Payer: 59 | Admitting: Family Medicine

## 2021-09-28 VITALS — BP 152/98 | HR 64 | Temp 98.0°F | Ht 67.0 in | Wt 194.4 lb

## 2021-09-28 DIAGNOSIS — J029 Acute pharyngitis, unspecified: Secondary | ICD-10-CM | POA: Diagnosis not present

## 2021-09-28 DIAGNOSIS — I10 Essential (primary) hypertension: Secondary | ICD-10-CM | POA: Diagnosis not present

## 2021-09-28 LAB — POCT RAPID STREP A (OFFICE): Rapid Strep A Screen: NEGATIVE

## 2021-09-28 MED ORDER — AZITHROMYCIN 250 MG PO TABS: ORAL_TABLET | ORAL | 0 refills | Status: DC

## 2021-09-28 MED ORDER — AZELASTINE HCL 0.1 % NA SOLN: 2.0000 | Freq: Two times a day (BID) | NASAL | 12 refills | Status: AC

## 2021-09-28 NOTE — Patient Instructions (Signed)
It was very nice to see you today! ? ?Please try the nasal spray.  Start the antibiotic if not improving in a couple of days. ? ?You can use Tylenol or Voltaren as needed for your ankle pain. ? ?Let us know if not improving in the next several days. ? ?Take care, ?Dr Jerline Pain ? ?PLEASE NOTE: ? ?If you had any lab tests please let us know if you have not heard back within a few days. You may see your results on mychart before we have a chance to review them but we will give you a call once they are reviewed by [place]. If we ordered any referrals today, please let us know if you have not heard from their office within the next week.  ? ?Please try these tips to maintain a healthy lifestyle: ? ?Eat at least 3 REAL meals and 1-2 snacks per day.  Aim for no more than 5 hours between eating.  If you eat breakfast, please do so within one hour of getting up.  ? ?Each meal should contain half fruits/vegetables, one quarter protein, and one quarter carbs (no bigger than a computer mouse) ? ?Cut down on sweet beverages. This includes juice, soda, and sweet tea.  ? ?Drink at least 1 glass of water with each meal and aim for at least 8 glasses per day ? ?Exercise at least 150 minutes every week.   ?

## 2021-09-28 NOTE — Progress Notes (Signed)
? ?  Walter Perez is a 66 y.o. male who presents today for an office visit. ? ?Assessment/Plan:  ?New/Acute Problems: ?Sinusitis ?No red flags. Rapid strep negative.  Start Astelin.  Also send in pocket scription for azithromycin with instruction to not start unless symptoms are not improving in the next several days.  He can use over-the-counter meds as needed.  Encouraged hydration.  We discussed reasons to return to care.  Follow-up as needed. ? ?Chronic Problems Addressed Today: ?Essential Hypertension ?Elevated today though patient states he has whitecoat hypertension.  Normally well controlled at home.  We will continue current regimen olmesartan-HCTZ 40-25 once daily and metoprolol succinate 100 mg daily.  We will continue home monitoring.  He will let us know if persistently elevated. ? ?Osteoarthritis ?No red flags.  We will avoid NSAIDs due to his cardiac history and daily Plavix prescription.  Recommended Tylenol and Voltaren.  He will follow up with PCP for ongoing management if this continues to be an issue. ? ? ?  ?Subjective:  ?HPI: ? ?Patient here with sore throat and congestion. Started about 3 days ago. Getting worse. No fevers. Some cough.  No known sick contacts.  No chest pain or SOB.  No treatments tried. ? ?He has also had some pain in his left ankle.  This has been an ongoing issue for several years.  No specific treatments tried.  Occasionally gets popping and clicking the area. ? ?   ?  ?Objective:  ?Physical Exam: ?Temp 98 ?F (36.7 ?C) (Temporal)   Ht '5\' 7"'$  (1.702 m)   Wt 194 lb 6.4 oz (88.2 kg)   BMI 30.45 kg/m?   ?Gen: No acute distress, resting comfortably ?HEENT: TMs clear.  OP erythematous.  Nose mucosa erythematous and boggy bilaterally.  No lymphadenopathy ?CV: Regular rate and rhythm with no murmurs appreciated ?Pulm: Normal work of breathing, clear to auscultation bilaterally with no crackles, wheezes, or rhonchi ?Neuro: Grossly normal, moves all extremities ?Psych: Normal  affect and thought content ? ?   ? ?Algis Greenhouse. Jerline Pain, MD ?09/28/2021 9:36 AM  ?

## 2021-10-19 ENCOUNTER — Other Ambulatory Visit: Payer: Self-pay | Admitting: Physician Assistant

## 2021-10-19 ENCOUNTER — Other Ambulatory Visit: Payer: Self-pay | Admitting: Family Medicine

## 2021-10-19 ENCOUNTER — Other Ambulatory Visit: Payer: Self-pay

## 2021-10-24 NOTE — Progress Notes (Signed)
? ?Cardiology Office Note ? ? ?Date:  10/26/2021  ? ?ID:  Walter Perez, DOB 05-Aug-1955, MRN [id number] ? ?PCP:  Allwardt, Randa Evens, PA-C  ?Cardiologist:   Dorris Carnes, MD  ? ?Pt presents for f/u of CAD   ? ?  ?History of Present Illness: ?Walter Perez is a 66 y.o. male with a history of CAD :Myovjue in Nov 2020 showed anteroapical ischemia   Cath showed 3 V CAD; On 06/19/19 he underwent CABG x 4 (SVG to  PDA and distal lCx; RIMA to LAD and OM1 as Y graft from LIMA.  PDA endarterectomy)  Post op he had some Afib  Finished 30 days of AMiodarone     The pt also hsa a hx of HL, HTN, MBGUS  ? ? ?I saw the pt in clinic in Feb 2022  Since seen he has done OK   Deneis CP   Breathing is OK  No dizziness    Does have some numbness in both hands with driving  ? ?Current Meds  ?Medication Sig  ? aspirin 81 MG EC tablet Take 81 mg by mouth daily.   ? atorvastatin (LIPITOR) 80 MG tablet TAKE 1 TABLET BY MOUTH ONCE DAILY AT  6PM  ? clopidogrel (PLAVIX) 75 MG tablet Take 1 tablet by mouth once daily  ? metoprolol succinate (TOPROL-XL) 100 MG 24 hr tablet TAKE 1 TABLET BY MOUTH ONCE DAILY TAKE  WITH  OR  IMMEDIATELY  FOLLOWING  A  MEAL  ? olmesartan-hydrochlorothiazide (BENICAR HCT) 40-25 MG tablet Take 1 tablet by mouth once daily  ? triamcinolone cream (KENALOG) 0.1 % Apply 1 application topically daily as needed.  ? ?Current Facility-Administered Medications for the 10/26/21 encounter (Office Visit) with Fay Records, MD  ?Medication  ? triamcinolone acetonide (KENALOG-40) injection 40 mg  ? ? ? ?Allergies:   Patient has no known allergies.  ? ?Past Medical History:  ?Diagnosis Date  ? Coronary artery disease   ? Hyperlipidemia   ? Hypertension   ? Myocardial infarction ([place]) 06/19/2019  ? ? ?Past Surgical History:  ?Procedure Laterality Date  ? CORONARY ARTERY BYPASS GRAFT  06/19/2019  ? quadruple  ? ? ? ?Social History:  The patient  reports that he has never smoked. He has never used smokeless tobacco. He reports that he does not  drink alcohol and does not use drugs.  ? ?Family History:  The patient's family history includes Alzheimer's disease in his sister; Kidney disease in his brother; Stroke in his father and mother.  ? ? ?ROS:  Please see the history of present illness. All other systems are reviewed and  Negative to the above problem except as noted.  ? ? ?PHYSICAL EXAM: ?VS:  BP (!) 140/100   Pulse 63   Ht '5\' 8"'$  (1.727 m)   Wt 196 lb 6.4 oz (89.1 kg)   SpO2 97%   BMI 29.86 kg/m?   ?GEN: OBese 66 yo n no acute distress  ?HEENT: normal  ?Neck: no JVD, carotid bruits, ?Cardiac: RRR; no murmurs,   NO LE  edema  ?Respiratory:  clear to auscultation bilaterally,  ?GI: soft, nontender, nondistended, + BS  No hepatomegaly  ?MS: no deformity Moving all extremities   ?Skin: warm and dry, no rash ?Neuro:  Strength and sensation are intact ?Psych: euthymic mood, full affect ? ? ?EKG:  EKG is ordered today.  NSR 63 bpm    ? ?Lipid Panel ?   ?Component Value Date/Time  ?  CHOL 131 08/06/2020 1607  ? TRIG 140 08/06/2020 1607  ? HDL 40 08/06/2020 1607  ? CHOLHDL 3.3 08/06/2020 1607  ? [place] 66 08/06/2020 1607  ? ?  ? ?Wt Readings from Last 3 Encounters:  ?10/26/21 196 lb 6.4 oz (89.1 kg)  ?09/28/21 194 lb 6.4 oz (88.2 kg)  ?08/29/21 192 lb 9.6 oz (87.4 kg)  ?  ? ? ?ASSESSMENT AND PLAN: ? ? ?1  CAD Pt s/p CABG ins 2020 at [facility]  No symptoms of angina   Keep on same meds  ? ?2 HL   Will check lipomed  ? ?3  HTN   BP is high here in clini  He says it is always high in doctors office "white coat " effect.   HE says it is better at home when he is relaxed .  Says his systolic pressures are in the  120s   ?I would recomm he bring in to next MD visit to compare   ? ?4  Diet  Discussed sugar intake LImit    ? ?Get labs from Dr Ks office   ? ? F/U next winter  ? ? ?Current medicines are reviewed at length with the patient today.  The patient does not have concerns regarding medicines. ? ?Signed, ?Dorris Carnes, MD  ?10/26/2021 4:20 PM    ?[facility] ?[place], [place],   [address] ?Phone: [phone]; Fax: [phone]  ? ? ?

## 2021-10-26 ENCOUNTER — Ambulatory Visit (INDEPENDENT_AMBULATORY_CARE_PROVIDER_SITE_OTHER): Payer: 59 | Admitting: Internal Medicine

## 2021-10-26 ENCOUNTER — Encounter: Payer: Self-pay | Admitting: Internal Medicine

## 2021-10-26 VITALS — BP 140/100 | HR 63 | Ht 68.0 in | Wt 196.4 lb

## 2021-10-26 DIAGNOSIS — Z79899 Other long term (current) drug therapy: Secondary | ICD-10-CM

## 2021-10-26 DIAGNOSIS — E782 Mixed hyperlipidemia: Secondary | ICD-10-CM

## 2021-10-26 MED ORDER — NITROGLYCERIN 0.4 MG SL SUBL: 0.4000 mg | SUBLINGUAL_TABLET | SUBLINGUAL | 3 refills | Status: DC | PRN

## 2021-10-26 NOTE — Patient Instructions (Signed)
Medication Instructions:  ? ?*If you need a refill on your cardiac medications before your next appointment, please call your pharmacy* ? ? ?Lab Work: ?LIPOMED AND HGBA1C ?If you have labs (blood work) drawn today and your tests are completely normal, you will receive your results only by: ?MyChart Message (if you have MyChart) OR ?A paper copy in the mail ?If you have any lab test that is abnormal or we need to change your treatment, we will call you to review the results. ? ? ?Testing/Procedures: ? ? ? ?Follow-Up: ?At [facility], you and your health needs are our priority.  As part of our continuing mission to provide you with exceptional heart care, we have created designated Provider Care Teams.  These Care Teams include your primary Cardiologist (physician) and Advanced Practice Providers (APPs -  Physician Assistants and Nurse Practitioners) who all work together to provide you with the care you need, when you need it. ? ?We recommend signing up for the patient portal called "MyChart".  Sign up information is provided on this After Visit Summary.  MyChart is used to connect with patients for Virtual Visits (Telemedicine).  Patients are able to view lab/test results, encounter notes, upcoming appointments, etc.  Non-urgent messages can be sent to your provider as well.   ?To learn more about what you can do with MyChart, go to [email or web address].   ? ?Your next appointment:   ?1 year(s) ? ?The format for your next appointment:   ?In Person ? ?Provider:   ?Dr Dorris Carnes ? ?If primary card or EP is not listed click here to update    :1}  ? ? ? ? ?Important Information About Sugar ? ? ? ? ?  ?

## 2021-10-27 LAB — APOLIPOPROTEIN B: Apolipoprotein B: 66 mg/dL (ref <90)

## 2021-10-27 LAB — NMR, LIPOPROFILE
Cholesterol, Total: 121 mg/dL (ref 100–199)
HDL Particle Number: 24.6 umol/L — ABNORMAL LOW (ref >=30.5)
HDL-C: 27 mg/dL — ABNORMAL LOW (ref >39)
LDL Particle Number: 802 nmol/L (ref <1000)
LDL Size: 19.7 nm — ABNORMAL LOW (ref >20.5)
LDL-C (NIH Calc): 52 mg/dL (ref 0–99)
LP-IR Score: 62 — ABNORMAL HIGH (ref <=45)
Small LDL Particle Number: 591 nmol/L — ABNORMAL HIGH (ref <=527)
Triglycerides: 268 mg/dL — ABNORMAL HIGH (ref 0–149)

## 2021-10-27 LAB — HEMOGLOBIN A1C
Est. average glucose Bld gHb Est-mCnc: 120 mg/dL
Hgb A1c MFr Bld: 5.8 % — ABNORMAL HIGH (ref 4.8–5.6)

## 2021-10-27 LAB — LIPOPROTEIN A (LPA): Lipoprotein (a): 31 nmol/L (ref <75.0)

## 2022-02-06 ENCOUNTER — Other Ambulatory Visit: Payer: Self-pay | Admitting: Physician Assistant

## 2022-02-14 ENCOUNTER — Other Ambulatory Visit: Payer: Self-pay | Admitting: Physician Assistant

## 2022-02-21 ENCOUNTER — Inpatient Hospital Stay: Payer: 59 | Attending: Hematology

## 2022-02-21 DIAGNOSIS — D472 Monoclonal gammopathy: Secondary | ICD-10-CM | POA: Insufficient documentation

## 2022-02-21 DIAGNOSIS — I10 Essential (primary) hypertension: Secondary | ICD-10-CM | POA: Insufficient documentation

## 2022-02-21 DIAGNOSIS — E876 Hypokalemia: Secondary | ICD-10-CM | POA: Diagnosis not present

## 2022-02-21 DIAGNOSIS — I2510 Atherosclerotic heart disease of native coronary artery without angina pectoris: Secondary | ICD-10-CM | POA: Diagnosis not present

## 2022-02-21 LAB — COMPREHENSIVE METABOLIC PANEL
ALT: 25 U/L (ref 0–44)
AST: 25 U/L (ref 15–41)
Albumin: 3.9 g/dL (ref 3.5–5.0)
Alkaline Phosphatase: 60 U/L (ref 38–126)
Anion gap: 8 (ref 5–15)
BUN: 15 mg/dL (ref 8–23)
CO2: 26 mmol/L (ref 22–32)
Calcium: 8.5 mg/dL — ABNORMAL LOW (ref 8.9–10.3)
Chloride: 101 mmol/L (ref 98–111)
Creatinine, Ser: 1.09 mg/dL (ref 0.61–1.24)
GFR, Estimated: >60 mL/min (ref >60)
Glucose, Bld: 104 mg/dL — ABNORMAL HIGH (ref 70–99)
Potassium: 2.9 mmol/L — ABNORMAL LOW (ref 3.5–5.1)
Sodium: 135 mmol/L (ref 135–145)
Total Bilirubin: 1.3 mg/dL — ABNORMAL HIGH (ref 0.3–1.2)
Total Protein: 7.8 g/dL (ref 6.5–8.1)

## 2022-02-21 LAB — CBC WITH DIFFERENTIAL/PLATELET
Abs Immature Granulocytes: 0.02 10*3/uL (ref 0.00–0.07)
Basophils Absolute: 0 10*3/uL (ref 0.0–0.1)
Basophils Relative: 1 %
Eosinophils Absolute: 0.4 10*3/uL (ref 0.0–0.5)
Eosinophils Relative: 8 %
HCT: 41.2 % (ref 39.0–52.0)
Hemoglobin: 14.1 g/dL (ref 13.0–17.0)
Immature Granulocytes: 0 %
Lymphocytes Relative: 20 %
Lymphs Abs: 1.1 10*3/uL (ref 0.7–4.0)
MCH: 29.1 pg (ref 26.0–34.0)
MCHC: 34.2 g/dL (ref 30.0–36.0)
MCV: 85.1 fL (ref 80.0–100.0)
Monocytes Absolute: 0.4 10*3/uL (ref 0.1–1.0)
Monocytes Relative: 8 %
Neutro Abs: 3.3 10*3/uL (ref 1.7–7.7)
Neutrophils Relative %: 63 %
Platelets: 163 10*3/uL (ref 150–400)
RBC: 4.84 MIL/uL (ref 4.22–5.81)
RDW: 12.8 % (ref 11.5–15.5)
WBC: 5.3 10*3/uL (ref 4.0–10.5)
nRBC: 0 % (ref 0.0–0.2)

## 2022-02-21 LAB — LACTATE DEHYDROGENASE: LDH: 165 U/L (ref 98–192)

## 2022-02-22 LAB — KAPPA/LAMBDA LIGHT CHAINS
Kappa free light chain: 9.5 mg/L (ref 3.3–19.4)
Kappa, lambda light chain ratio: 0.05 — ABNORMAL LOW (ref 0.26–1.65)
Lambda free light chains: 203.7 mg/L — ABNORMAL HIGH (ref 5.7–26.3)

## 2022-02-23 LAB — PROTEIN ELECTROPHORESIS, SERUM
A/G Ratio: 1.2 (ref 0.7–1.7)
Albumin ELP: 3.9 g/dL (ref 2.9–4.4)
Alpha-1-Globulin: 0.2 g/dL (ref 0.0–0.4)
Alpha-2-Globulin: 0.5 g/dL (ref 0.4–1.0)
Beta Globulin: 0.6 g/dL — ABNORMAL LOW (ref 0.7–1.3)
Gamma Globulin: 2 g/dL — ABNORMAL HIGH (ref 0.4–1.8)
Globulin, Total: 3.3 g/dL (ref 2.2–3.9)
M-Spike, %: 1.7 g/dL — ABNORMAL HIGH
Total Protein ELP: 7.2 g/dL (ref 6.0–8.5)

## 2022-02-28 ENCOUNTER — Inpatient Hospital Stay (HOSPITAL_BASED_OUTPATIENT_CLINIC_OR_DEPARTMENT_OTHER): Payer: 59 | Admitting: Hematology

## 2022-02-28 VITALS — BP 162/96 | HR 64 | Temp 97.5°F | Resp 18 | Ht 67.0 in | Wt 197.2 lb

## 2022-02-28 DIAGNOSIS — I10 Essential (primary) hypertension: Secondary | ICD-10-CM | POA: Diagnosis not present

## 2022-02-28 DIAGNOSIS — D472 Monoclonal gammopathy: Secondary | ICD-10-CM

## 2022-02-28 DIAGNOSIS — I2510 Atherosclerotic heart disease of native coronary artery without angina pectoris: Secondary | ICD-10-CM | POA: Diagnosis not present

## 2022-02-28 DIAGNOSIS — E876 Hypokalemia: Secondary | ICD-10-CM | POA: Diagnosis not present

## 2022-02-28 MED ORDER — POTASSIUM CHLORIDE CRYS ER 20 MEQ PO TBCR: 20.0000 meq | EXTENDED_RELEASE_TABLET | Freq: Every day | ORAL | 6 refills | Status: DC

## 2022-02-28 NOTE — Patient Instructions (Signed)
[place] at [facility] Discharge Instructions   You were seen and examined today by Dr. Delton Coombes.  He reviewed the results of your lab work which are normal/stable.   We will see you back in 6 months. We will repeat a skeletal survey and lab work prior to your next visit.    Thank you for choosing [place] at [facility] to provide your oncology and hematology care.  To afford each patient quality time with our provider, please arrive at least 15 minutes before your scheduled appointment time.   If you have a lab appointment with the [facility] please come in thru the Main Entrance and check in at the main information desk.  You need to re-schedule your appointment should you arrive 10 or more minutes late.  We strive to give you quality time with our providers, and arriving late affects you and other patients whose appointments are after yours.  Also, if you no show three or more times for appointments you may be dismissed from the clinic at the providers discretion.     Again, thank you for choosing [facility].  Our hope is that these requests will decrease the amount of time that you wait before being seen by our physicians.       _____________________________________________________________  Should you have questions after your visit to [facility], please contact our office at [phone] and follow the prompts.  Our office hours are 8:00 a.m. and 4:30 p.m. Monday - Friday.  Please note that voicemails left after 4:00 p.m. may not be returned until the following business day.  We are closed weekends and major holidays.  You do have access to a nurse 24-7, just call the main number to the clinic [phone] and do not press any options, hold on the line and a nurse will answer the phone.    For prescription refill requests, have your pharmacy contact our office and allow 72 hours.    Due to Covid, you  will need to wear a mask upon entering the hospital. If you do not have a mask, a mask will be given to you at the Main Entrance upon arrival. For doctor visits, patients may have 1 support person age 70 or older with them. For treatment visits, patients can not have anyone with them due to social distancing guidelines and our immunocompromised population.

## 2022-02-28 NOTE — Progress Notes (Signed)
[place], [place] [address]   CLINIC:  Medical Oncology/Hematology  PCP:  Fredirick Lathe, PA-C [place] / [place] [address]  [phone]  REASON FOR VISIT:  Follow-up for smoldering myeloma  PRIOR THERAPY: none  CURRENT THERAPY: surveillance  INTERVAL HISTORY:  Mr. Walter Perez, a 66 y.o. male, seen for follow-up of smoldering myeloma.  From last visit 6 months ago, he denied any recurrent infections.  He had some pain in the left thumb muscles from lifting weights.  Denies any new onset bone pains.  REVIEW OF SYSTEMS:  Review of Systems  Constitutional:  Negative for appetite change and fatigue.  Genitourinary:  Negative for frequency.   Neurological:  Negative for numbness.  Psychiatric/Behavioral:  Negative for sleep disturbance.   All other systems reviewed and are negative.   PAST MEDICAL/SURGICAL HISTORY:  Past Medical History:  Diagnosis Date   Coronary artery disease    Hyperlipidemia    Hypertension    Myocardial infarction ([place]) 06/19/2019   Past Surgical History:  Procedure Laterality Date   CORONARY ARTERY BYPASS GRAFT  06/19/2019   quadruple    SOCIAL HISTORY:  Social History   Socioeconomic History   Marital status: Married    Spouse name: Not on file   Number of children: 2   Years of education: Not on file   Highest education level: Not on file  Occupational History   Occupation: Unemployed  Tobacco Use   Smoking status: Never   Smokeless tobacco: Never  Substance and Sexual Activity   Alcohol use: Never   Drug use: Never   Sexual activity: Yes  Other Topics Concern   Not on file  Social History Narrative   Not on file   Social Determinants of Health   Financial Resource Strain: Not on file  Food Insecurity: Not on file  Transportation Needs: Not on file  Physical Activity: Not on file  Stress: Not on file  Social Connections: Not on file  Intimate Partner Violence:  Not At Risk (08/25/2020)   Humiliation, Afraid, Rape, and Kick questionnaire    Fear of Current or Ex-Partner: No    Emotionally Abused: No    Physically Abused: No    Sexually Abused: No    FAMILY HISTORY:  Family History  Problem Relation Age of Onset   Stroke Mother    Stroke Father    Kidney disease Brother    Alzheimer's disease Sister     CURRENT MEDICATIONS:  Current Outpatient Medications  Medication Sig Dispense Refill   aspirin 81 MG EC tablet Take 81 mg by mouth daily.      atorvastatin (LIPITOR) 80 MG tablet TAKE 1 TABLET BY MOUTH ONCE DAILY AT  6PM 90 tablet 0   azelastine (ASTELIN) 0.1 % nasal spray Place 2 sprays into both nostrils 2 (two) times daily. 30 mL 12   clopidogrel (PLAVIX) 75 MG tablet Take 1 tablet by mouth once daily 90 tablet 0   metoprolol succinate (TOPROL-XL) 100 MG 24 hr tablet TAKE 1 TABLET BY MOUTH ONCE DAILY TAKE  WITH  OR  IMMEDIATELY  FOLLOWING  A  MEAL 90 tablet 2   olmesartan-hydrochlorothiazide (BENICAR HCT) 40-25 MG tablet Take 1 tablet by mouth once daily 90 tablet 0   potassium chloride SA (KLOR-CON M) 20 MEQ tablet Take 1 tablet (20 mEq total) by mouth daily. 30 tablet 6   triamcinolone cream (KENALOG) 0.1 % Apply 1 application topically  daily as needed. 453 g 3   nitroGLYCERIN (NITROSTAT) 0.4 MG SL tablet Place 1 tablet (0.4 mg total) under the tongue every 5 (five) minutes as needed for chest pain. (Patient not taking: Reported on 02/28/2022) 25 tablet 3   Current Facility-Administered Medications  Medication Dose Route Frequency Provider Last Rate Last Admin   triamcinolone acetonide (KENALOG-40) injection 40 mg  40 mg Intramuscular Once Lavonna Monarch, MD        ALLERGIES:  No Known Allergies  PHYSICAL EXAM:  Performance status (ECOG): 0 - Asymptomatic  Vitals:   02/28/22 1056  BP: (!) 162/96  Pulse: 64  Resp: 18  Temp: (!) 97.5 F (36.4 C)  SpO2: 98%   Wt Readings from Last 3 Encounters:  02/28/22 197 lb 3.2 oz (89.4  kg)  10/26/21 196 lb 6.4 oz (89.1 kg)  09/28/21 194 lb 6.4 oz (88.2 kg)   Physical Exam Vitals reviewed.  Constitutional:      Appearance: Normal appearance.  Cardiovascular:     Rate and Rhythm: Normal rate and regular rhythm.     Pulses: Normal pulses.     Heart sounds: Normal heart sounds.  Pulmonary:     Effort: Pulmonary effort is normal.     Breath sounds: Normal breath sounds.  Neurological:     General: No focal deficit present.     Mental Status: He is alert and oriented to person, place, and time.  Psychiatric:        Mood and Affect: Mood normal.        Behavior: Behavior normal.     LABORATORY DATA:  I have reviewed the labs as listed.     Latest Ref Rng & Units 02/21/2022   10:51 AM 08/22/2021    9:45 AM 02/18/2021    9:30 AM  CBC  WBC 4.0 - 10.5 K/uL 5.3  6.1  5.6   Hemoglobin 13.0 - 17.0 g/dL 14.1  14.5  14.2   Hematocrit 39.0 - 52.0 % 41.2  44.4  42.6   Platelets 150 - 400 K/uL 163  181  161       Latest Ref Rng & Units 02/21/2022   10:51 AM 08/22/2021    9:45 AM 02/18/2021    9:30 AM  CMP  Glucose 70 - 99 mg/dL 104  111  106   BUN 8 - 23 mg/dL 15  18  14    Creatinine 0.61 - 1.24 mg/dL 1.09  1.00  1.09   Sodium 135 - 145 mmol/L 135  136  134   Potassium 3.5 - 5.1 mmol/L 2.9  3.3  3.5   Chloride 98 - 111 mmol/L 101  99  98   CO2 22 - 32 mmol/L 26  30  30    Calcium 8.9 - 10.3 mg/dL 8.5  8.8  8.9   Total Protein 6.5 - 8.1 g/dL 7.8  7.8  8.0   Total Bilirubin 0.3 - 1.2 mg/dL 1.3  0.8  1.1   Alkaline Phos 38 - 126 U/L 60  57  59   AST 15 - 41 U/L 25  22  21    ALT 0 - 44 U/L 25  24  23        Component Value Date/Time   RBC 4.84 02/21/2022 1051   MCV 85.1 02/21/2022 1051   MCV 86 08/06/2020 1607   MCH 29.1 02/21/2022 1051   MCHC 34.2 02/21/2022 1051   RDW 12.8 02/21/2022 1051   RDW 13.1 08/06/2020 1607  LYMPHSABS 1.1 02/21/2022 1051   MONOABS 0.4 02/21/2022 1051   EOSABS 0.4 02/21/2022 1051   BASOSABS 0.0 02/21/2022 1051    DIAGNOSTIC  IMAGING:  I have independently reviewed the scans and discussed with the patient. No results found.   ASSESSMENT:  1.  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  Lambda light chains 303, ratio of 0.03.  LDH normal.  Beta-2 microglobulin 1.7.  Serum viscosity normal.  Hemoglobin, calcium and creatinine normal. -Bone marrow biopsy on 04/30/2019 showed atypical plasma cells representing 12% in the aspirate.  Plasma cells display lambda light chain restriction. -Myeloma FISH panel shows gain of 1 q.  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 -24-hour urine negative for immunofixation and UPEP.  Urine total protein was undetectable. -Skeletal survey on 05/09/2019 did not show any evidence of lytic lesions.  Insurance refused PET CT scan. -MRI of the thoracic, lumbar spine and pelvis with and without contrast on 06/18/2019 did not show any evidence of myeloma. -Based on Mayo 2018 risk stratification system, his M spike is more than 2 g/dL.  Involved/uninvolved free light chain ratio was more than 20.  Bone marrow plasma cells were less than 20%.  With 2 risk factors, he was considered high risk with estimated median time to progression of 29 months, estimated risk of progression of 24 %/year during first 2 years, 11 %/year for the next 3 years, 3 %/year for the next 5 years.   2.  CAD: -Status post CABG at [facility] in December 2020.   PLAN:  1.  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  Creatinine and calcium are normal.  Hemoglobin was normal.  Lambda light chains at 203, improved from 227.  Ratio is stable at 0.05. - Skeletal survey on 08/29/2021: Negative for lytic lesions. - No "crab" features to warrant treatment at this time. - RTC 6 months with repeat labs and skeletal survey.  2.  Hypokalemia: - Potassium is 2.9.  Likely related to olmesartan/HCTZ. - We will start  him on potassium 20 mEq daily.  Orders placed this encounter:  Orders Placed This Encounter  Procedures   DG Bone Survey Met   CBC with Differential   Comprehensive metabolic panel   Lactate dehydrogenase   Kappa/lambda light chains   Protein electrophoresis, serum      Derek Jack, MD [place] [phone]

## 2022-03-09 ENCOUNTER — Encounter: Payer: Self-pay | Admitting: Physician Assistant

## 2022-03-09 ENCOUNTER — Ambulatory Visit (INDEPENDENT_AMBULATORY_CARE_PROVIDER_SITE_OTHER): Payer: 59 | Admitting: Physician Assistant

## 2022-03-09 VITALS — BP 136/88 | HR 60 | Temp 97.6°F | Ht 67.0 in | Wt 198.4 lb

## 2022-03-09 DIAGNOSIS — M778 Other enthesopathies, not elsewhere classified: Secondary | ICD-10-CM

## 2022-03-09 DIAGNOSIS — R972 Elevated prostate specific antigen [PSA]: Secondary | ICD-10-CM

## 2022-03-09 DIAGNOSIS — E876 Hypokalemia: Secondary | ICD-10-CM

## 2022-03-09 DIAGNOSIS — E782 Mixed hyperlipidemia: Secondary | ICD-10-CM | POA: Diagnosis not present

## 2022-03-09 DIAGNOSIS — Z0000 Encounter for general adult medical examination without abnormal findings: Secondary | ICD-10-CM

## 2022-03-09 DIAGNOSIS — R739 Hyperglycemia, unspecified: Secondary | ICD-10-CM | POA: Diagnosis not present

## 2022-03-09 DIAGNOSIS — Z125 Encounter for screening for malignant neoplasm of prostate: Secondary | ICD-10-CM

## 2022-03-09 LAB — POTASSIUM: Potassium: 4.4 mEq/L (ref 3.5–5.1)

## 2022-03-09 LAB — PSA: PSA: 2.86 ng/mL (ref 0.10–4.00)

## 2022-03-09 LAB — HEMOGLOBIN A1C: Hgb A1c MFr Bld: 6.5 % (ref 4.6–6.5)

## 2022-03-09 MED ORDER — DICLOFENAC SODIUM 1 % EX GEL
2.0000 g | Freq: Four times a day (QID) | CUTANEOUS | 0 refills | Status: AC
Start: 2022-03-09 — End: 2022-04-08

## 2022-03-09 NOTE — Assessment & Plan Note (Signed)
Age-appropriate screening and counseling performed today. Will check labs and call with results. Preventive measures discussed and printed in AVS for patient.   Patient Counseling: '[x]'$   Nutrition: Stressed importance of moderation in sodium/caffeine intake, saturated fat and cholesterol, caloric balance, sufficient intake of fresh fruits, vegetables, and fiber.  '[x]'$   Stressed the importance of regular exercise.   '[]'$   Substance Abuse: Discussed cessation/primary prevention of tobacco, alcohol, or other drug use; driving or other dangerous activities under the influence; availability of treatment for abuse.   '[x]'$   Injury prevention: Discussed safety belts, safety helmets, smoke detector, smoking near bedding or upholstery.   '[x]'$   Sexuality: Discussed sexually transmitted diseases, partner selection, use of condoms, avoidance of unintended pregnancy  and contraceptive alternatives.   '[x]'$   Dental health: Discussed importance of regular tooth brushing, flossing, and dental visits.  '[x]'$   Health maintenance and immunizations reviewed. Please refer to Health maintenance section.    Advised vaccine updates, he declined at this time.

## 2022-03-09 NOTE — Patient Instructions (Addendum)
Good to see you today! See handout about Golfer's elbow. Use Voltaren's gel and ice over this area. Let me know if worse or no better in next 4 weeks.  Labs today  Keep up good work with health!

## 2022-03-09 NOTE — Assessment & Plan Note (Signed)
Recheck level today 

## 2022-03-09 NOTE — Progress Notes (Signed)
Subjective:    Patient ID: Walter Perez, male    DOB: 1955-07-13, 66 y.o.   MRN: [id number]  Chief Complaint  Patient presents with   Annual Exam    Pt in for annual CPE; pt had recent labs drawn with another provider; pt c/o only soreness in left forearm;     HPI Patient is in today for annual exam.  Acute concerns: Left upper arm pain x 2 weeks   Health maintenance: Lifestyle/ exercise: Walks a mile a day, has a chicken farm, constantly lifting heavy supplies Nutrition: Cooks most foods, not much eating out  Mental health: Feeling well overall  Caffeine: Occasional coffee in the mornings Sleep: Sleeps well overall, usually up 1-2x during the night to urinate Substance use: None ETOH: None  Sexual activity: Active, declines screening at this time  Immunizations: Declines updates at this time Colonoscopy: Cologuard UTD   Past Medical History:  Diagnosis Date   Coronary artery disease    Hyperlipidemia    Hypertension    Myocardial infarction ([place]) 06/19/2019    Past Surgical History:  Procedure Laterality Date   CORONARY ARTERY BYPASS GRAFT  06/19/2019   quadruple    Family History  Problem Relation Age of Onset   Stroke Mother    Stroke Father    Kidney disease Brother    Alzheimer's disease Sister     Social History   Tobacco Use   Smoking status: Never   Smokeless tobacco: Never  Substance Use Topics   Alcohol use: Never   Drug use: Never     No Known Allergies  Review of Systems NEGATIVE UNLESS OTHERWISE INDICATED IN HPI      Objective:     BP 136/88 (BP Location: Right Arm)   Pulse 60   Temp 97.6 F (36.4 C) (Temporal)   Ht '5\' 7"'$  (1.702 m)   Wt 198 lb 6.4 oz (90 kg)   SpO2 99%   BMI 31.07 kg/m   Wt Readings from Last 3 Encounters:  03/09/22 198 lb 6.4 oz (90 kg)  02/28/22 197 lb 3.2 oz (89.4 kg)  10/26/21 196 lb 6.4 oz (89.1 kg)    BP Readings from Last 3 Encounters:  03/09/22 136/88  02/28/22 (!) 162/96  10/26/21 (!)  140/100     Physical Exam Vitals and nursing note reviewed.  Constitutional:      General: He is not in acute distress.    Appearance: Normal appearance. He is not toxic-appearing.  HENT:     Head: Normocephalic and atraumatic.     Right Ear: Tympanic membrane, ear canal and external ear normal.     Left Ear: Tympanic membrane, ear canal and external ear normal.     Nose: Nose normal.     Mouth/Throat:     Mouth: Mucous membranes are moist.     Pharynx: Oropharynx is clear.  Eyes:     Extraocular Movements: Extraocular movements intact.     Conjunctiva/sclera: Conjunctivae normal.     Pupils: Pupils are equal, round, and reactive to light.  Cardiovascular:     Rate and Rhythm: Normal rate and regular rhythm.     Pulses: Normal pulses.     Heart sounds: Normal heart sounds.  Pulmonary:     Effort: Pulmonary effort is normal.     Breath sounds: Normal breath sounds.  Abdominal:     General: Abdomen is flat. Bowel sounds are normal.     Palpations: Abdomen is soft.  Tenderness: There is no abdominal tenderness.  Musculoskeletal:        General: Tenderness (left lateral epicondyle) present. No swelling or deformity. Normal range of motion.     Cervical back: Normal range of motion and neck supple.     Right lower leg: No edema.     Left lower leg: No edema.  Skin:    General: Skin is warm and dry.  Neurological:     General: No focal deficit present.     Mental Status: He is alert and oriented to person, place, and time.  Psychiatric:        Mood and Affect: Mood normal.        Behavior: Behavior normal.        Assessment & Plan:  Encounter for annual physical exam Assessment & Plan: Age-appropriate screening and counseling performed today. Will check labs and call with results. Preventive measures discussed and printed in AVS for patient.   Patient Counseling: '[x]'$   Nutrition: Stressed importance of moderation in sodium/caffeine intake, saturated fat and  cholesterol, caloric balance, sufficient intake of fresh fruits, vegetables, and fiber.  '[x]'$   Stressed the importance of regular exercise.   '[]'$   Substance Abuse: Discussed cessation/primary prevention of tobacco, alcohol, or other drug use; driving or other dangerous activities under the influence; availability of treatment for abuse.   '[x]'$   Injury prevention: Discussed safety belts, safety helmets, smoke detector, smoking near bedding or upholstery.   '[x]'$   Sexuality: Discussed sexually transmitted diseases, partner selection, use of condoms, avoidance of unintended pregnancy  and contraceptive alternatives.   '[x]'$   Dental health: Discussed importance of regular tooth brushing, flossing, and dental visits.  '[x]'$   Health maintenance and immunizations reviewed. Please refer to Health maintenance section.    Advised vaccine updates, he declined at this time.    Hypokalemia Assessment & Plan: Lab Results  Component Value Date   K 2.9 (L) 02/21/2022   Recently checked by oncologist - started on Potassium supplement daily Recheck today  Orders: -     Potassium  Mixed hyperlipidemia Assessment & Plan: Lipitor 80 mg daily Follows with cardiology   Hyperglycemia -     Hemoglobin A1c  Prostate cancer screening -     PSA  Left elbow tendonitis  Elevated PSA Assessment & Plan: Recheck level today   Other orders -     Diclofenac Sodium; Apply 2 g topically 4 (four) times daily.  Dispense: 100 g; Refill: 0     Return in about 1 year (around 03/10/2023) for CPE, labs .   Odalis Jordan M Vernesha Talbot, PA-C

## 2022-03-09 NOTE — Assessment & Plan Note (Signed)
Lipitor 80 mg daily Follows with cardiology

## 2022-03-09 NOTE — Assessment & Plan Note (Signed)
Lab Results  Component Value Date   K 2.9 (L) 02/21/2022   Recently checked by oncologist - started on Potassium supplement daily Recheck today

## 2022-05-31 ENCOUNTER — Other Ambulatory Visit: Payer: Self-pay | Admitting: Physician Assistant

## 2022-06-15 ENCOUNTER — Encounter: Payer: Self-pay | Admitting: *Deleted

## 2022-07-22 ENCOUNTER — Other Ambulatory Visit: Payer: Self-pay | Admitting: Internal Medicine

## 2022-08-21 ENCOUNTER — Other Ambulatory Visit: Payer: Self-pay | Admitting: Physician Assistant

## 2022-08-31 ENCOUNTER — Inpatient Hospital Stay: Payer: Commercial Managed Care - HMO | Attending: Hematology | Admitting: Hematology

## 2022-08-31 ENCOUNTER — Ambulatory Visit (HOSPITAL_COMMUNITY)
Admission: RE | Admit: 2022-08-31 | Discharge: 2022-08-31 | Disposition: A | Discharge status: Home / Self Care | Payer: Commercial Managed Care - HMO | Source: Ambulatory Visit | Attending: Hematology | Admitting: Hematology

## 2022-08-31 DIAGNOSIS — D472 Monoclonal gammopathy: Secondary | ICD-10-CM | POA: Insufficient documentation

## 2022-08-31 LAB — COMPREHENSIVE METABOLIC PANEL
ALT: 24 U/L (ref 0–44)
AST: 25 U/L (ref 15–41)
Albumin: 3.9 g/dL (ref 3.5–5.0)
Alkaline Phosphatase: 72 U/L (ref 38–126)
Anion gap: 7 (ref 5–15)
BUN: 23 mg/dL (ref 8–23)
CO2: 25 mmol/L (ref 22–32)
Calcium: 8.3 mg/dL — ABNORMAL LOW (ref 8.9–10.3)
Chloride: 101 mmol/L (ref 98–111)
Creatinine, Ser: 1.42 mg/dL — ABNORMAL HIGH (ref 0.61–1.24)
GFR, Estimated: 55 mL/min — ABNORMAL LOW (ref >60)
Glucose, Bld: 95 mg/dL (ref 70–99)
Potassium: 3.1 mmol/L — ABNORMAL LOW (ref 3.5–5.1)
Sodium: 133 mmol/L — ABNORMAL LOW (ref 135–145)
Total Bilirubin: 0.7 mg/dL (ref 0.3–1.2)
Total Protein: 7.6 g/dL (ref 6.5–8.1)

## 2022-08-31 LAB — CBC WITH DIFFERENTIAL/PLATELET
Abs Immature Granulocytes: 0.03 10*3/uL (ref 0.00–0.07)
Basophils Absolute: 0 10*3/uL (ref 0.0–0.1)
Basophils Relative: 1 %
Eosinophils Absolute: 0.3 10*3/uL (ref 0.0–0.5)
Eosinophils Relative: 5 %
HCT: 42.5 % (ref 39.0–52.0)
Hemoglobin: 14.3 g/dL (ref 13.0–17.0)
Immature Granulocytes: 1 %
Lymphocytes Relative: 18 %
Lymphs Abs: 1.1 10*3/uL (ref 0.7–4.0)
MCH: 28.8 pg (ref 26.0–34.0)
MCHC: 33.6 g/dL (ref 30.0–36.0)
MCV: 85.7 fL (ref 80.0–100.0)
Monocytes Absolute: 0.5 10*3/uL (ref 0.1–1.0)
Monocytes Relative: 7 %
Neutro Abs: 4.4 10*3/uL (ref 1.7–7.7)
Neutrophils Relative %: 68 %
Platelets: 164 10*3/uL (ref 150–400)
RBC: 4.96 MIL/uL (ref 4.22–5.81)
RDW: 12.8 % (ref 11.5–15.5)
WBC: 6.4 10*3/uL (ref 4.0–10.5)
nRBC: 0 % (ref 0.0–0.2)

## 2022-08-31 LAB — LACTATE DEHYDROGENASE: LDH: 168 U/L (ref 98–192)

## 2022-09-01 LAB — KAPPA/LAMBDA LIGHT CHAINS
Kappa free light chain: 9.1 mg/L (ref 3.3–19.4)
Kappa, lambda light chain ratio: 0.05 — ABNORMAL LOW (ref 0.26–1.65)
Lambda free light chains: 194.9 mg/L — ABNORMAL HIGH (ref 5.7–26.3)

## 2022-09-04 ENCOUNTER — Encounter: Payer: Self-pay | Admitting: *Deleted

## 2022-09-04 NOTE — Progress Notes (Signed)
Dr. Delton Coombes made aware of K 3.1.  Patient instructed to take 1 extra potassium today.

## 2022-09-06 ENCOUNTER — Other Ambulatory Visit: Payer: Self-pay | Admitting: *Deleted

## 2022-09-06 ENCOUNTER — Telehealth: Payer: Self-pay | Admitting: *Deleted

## 2022-09-06 LAB — PROTEIN ELECTROPHORESIS, SERUM
A/G Ratio: 1.2 (ref 0.7–1.7)
Albumin ELP: 3.9 g/dL (ref 2.9–4.4)
Alpha-1-Globulin: 0.2 g/dL (ref 0.0–0.4)
Alpha-2-Globulin: 0.6 g/dL (ref 0.4–1.0)
Beta Globulin: 0.7 g/dL (ref 0.7–1.3)
Gamma Globulin: 1.8 g/dL (ref 0.4–1.8)
Globulin, Total: 3.3 g/dL (ref 2.2–3.9)
M-Spike, %: 1.6 g/dL — ABNORMAL HIGH
Total Protein ELP: 7.2 g/dL (ref 6.0–8.5)

## 2022-09-06 MED ORDER — POTASSIUM CHLORIDE CRYS ER 20 MEQ PO TBCR: 20.0000 meq | EXTENDED_RELEASE_TABLET | Freq: Every day | ORAL | 3 refills | Status: DC

## 2022-09-06 NOTE — Telephone Encounter (Signed)
Patient aware to take extra potassium today.  Requested a 90 day fill on Klor-Con M and was sent to pharmacy.

## 2022-09-06 NOTE — Telephone Encounter (Signed)
[facility] sent and was unread.  Left VM for patient to take 1 extra potassium pill due to low result.

## 2022-09-07 ENCOUNTER — Inpatient Hospital Stay: Payer: Commercial Managed Care - HMO | Attending: Hematology | Admitting: Hematology

## 2022-09-07 VITALS — BP 137/90 | HR 77 | Temp 98.5°F | Resp 16 | Wt 205.7 lb

## 2022-09-07 DIAGNOSIS — I10 Essential (primary) hypertension: Secondary | ICD-10-CM | POA: Insufficient documentation

## 2022-09-07 DIAGNOSIS — E876 Hypokalemia: Secondary | ICD-10-CM | POA: Insufficient documentation

## 2022-09-07 DIAGNOSIS — D472 Monoclonal gammopathy: Secondary | ICD-10-CM | POA: Insufficient documentation

## 2022-09-07 LAB — BASIC METABOLIC PANEL
Anion gap: 8 (ref 5–15)
BUN: 20 mg/dL (ref 8–23)
CO2: 29 mmol/L (ref 22–32)
Calcium: 8.6 mg/dL — ABNORMAL LOW (ref 8.9–10.3)
Chloride: 98 mmol/L (ref 98–111)
Creatinine, Ser: 1.39 mg/dL — ABNORMAL HIGH (ref 0.61–1.24)
GFR, Estimated: 57 mL/min — ABNORMAL LOW (ref >60)
Glucose, Bld: 96 mg/dL (ref 70–99)
Potassium: 3.3 mmol/L — ABNORMAL LOW (ref 3.5–5.1)
Sodium: 135 mmol/L (ref 135–145)

## 2022-09-07 MED ORDER — AMOXICILLIN-POT CLAVULANATE 875-125 MG PO TABS: 1.0000 | ORAL_TABLET | Freq: Two times a day (BID) | ORAL | 0 refills | Status: DC

## 2022-09-07 NOTE — Patient Instructions (Signed)
[facility]  Discharge Instructions  You were seen and examined today by Dr. Delton Coombes.  Dr. Delton Coombes discussed your most recent lab work which revealed that everything looks good except your kidney number is slightly elevated and potassium is low.  Dr. Delton Coombes is going to order lab today to see if your kidney function and potassium have come back to normal. We will call you with the results.  Follow-up as scheduled in 6 months with labs.    Thank you for choosing [facility] to provide your oncology and hematology care.   To afford each patient quality time with our provider, please arrive at least 15 minutes before your scheduled appointment time. You may need to reschedule your appointment if you arrive late (10 or more minutes). Arriving late affects you and other patients whose appointments are after yours.  Also, if you miss three or more appointments without notifying the office, you may be dismissed from the clinic at the provider's discretion.    Again, thank you for choosing [facility].  Our hope is that these requests will decrease the amount of time that you wait before being seen by our physicians.   If you have a lab appointment with the [facility] please come in thru the Main Entrance and check in at the main information desk.           _____________________________________________________________  Should you have questions after your visit to [facility], please contact our office at [phone] and follow the prompts.  Our office hours are 8:00 a.m. to 4:30 p.m. Monday - Thursday and 8:00 a.m. to 2:30 p.m. Friday.  Please note that voicemails left after 4:00 p.m. may not be returned until the following business day.  We are closed weekends and all major holidays.  You do have access to a nurse 24-7, just call the main number to the clinic [phone] and do not press any options,  hold on the line and a nurse will answer the phone.    For prescription refill requests, have your pharmacy contact our office and allow 72 hours.    Masks are optional in the cancer centers. If you would like for your care team to wear a mask while they are taking care of you, please let them know. You may have one support person who is at least 65 years old accompany you for your appointments.

## 2022-09-07 NOTE — Progress Notes (Signed)
[facility] [address], [place] [address]    Clinic Day:  09/07/2022  Referring physician: Allwardt, Randa Evens, PA-C  Patient Care Team: Allwardt, Nicholes Rough as PCP - General (Physician Assistant) Lavonna Monarch, MD (Inactive) as Consulting Physician (Dermatology)   ASSESSMENT & PLAN:   Assessment: 1.  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  Lambda light chains 303, ratio of 0.03.  LDH normal.  Beta-2 microglobulin 1.7.  Serum viscosity normal.  Hemoglobin, calcium and creatinine normal. -Bone marrow biopsy on 04/30/2019 showed atypical plasma cells representing 12% in the aspirate.  Plasma cells display lambda light chain restriction. -Myeloma FISH panel shows gain of 1 q.  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 -24-hour urine negative for immunofixation and UPEP.  Urine total protein was undetectable. -Skeletal survey on 05/09/2019 did not show any evidence of lytic lesions.  Insurance refused PET CT scan. -MRI of the thoracic, lumbar spine and pelvis with and without contrast on 06/18/2019 did not show any evidence of myeloma. -Based on Mayo 2018 risk stratification system, his M spike is more than 2 g/dL.  Involved/uninvolved free light chain ratio was more than 20.  Bone marrow plasma cells were less than 20%.  With 2 risk factors, he was considered high risk with estimated median time to progression of 29 months, estimated risk of progression of 24 %/year during first 2 years, 11 %/year for the next 3 years, 3 %/year for the next 5 years.   2.  CAD: -Status post CABG at [facility] in December 2020.  Plan: 1.  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  No infections in the last 6 months. - Reviewed labs from 08/31/2022.  Calcium is 8.3 but his creatinine increased to 1.42.  M spike is stable at 1.6 g.  Hemoglobin stable at 14.3.  Lambda light chains are  194 and stable.  Free light chain ratio is 0.05 and stable. - Skeletal survey on 09/29/2022 reviewed by me was negative for lytic lesions. - We will repeat his creatinine today and make sure it is at his baseline.  Will see him back in 6 months for follow-up with repeat myeloma labs.  We will repeat x-rays in 1 year.   2.  Hypokalemia: - His potassium was 3.1 on 08/31/2022.  He was taking potassium 20 mg daily.  It was increased to twice daily.  Will check potassium today.  Orders Placed This Encounter  Procedures   Basic metabolic panel    Standing Status:   Future    Number of Occurrences:   1    Standing Expiration Date:   09/07/2023   CBC with Differential/Platelet    Standing Status:   Future    Standing Expiration Date:   09/07/2023    Order Specific Question:   Release to patient    Answer:   Immediate   Comprehensive metabolic panel    Standing Status:   Future    Standing Expiration Date:   09/07/2023    Order Specific Question:   Release to patient    Answer:   Immediate   Protein electrophoresis, serum    Standing Status:   Future    Standing Expiration Date:   09/07/2023    Order Specific Question:   Release to patient    Answer:   Immediate   Kappa/lambda light chains  Standing Status:   Future    Standing Expiration Date:   09/07/2023      I,Alexis Herring,acting as a scribe for Derek Jack, MD.,have documented all relevant documentation on the behalf of Derek Jack, MD,as directed by  Derek Jack, MD while in the presence of Derek Jack, MD.   I, Derek Jack MD, have reviewed the above documentation for accuracy and completeness, and I agree with the above.   Derek Jack, MD   3/7/20243:24 PM  CHIEF COMPLAINT:   Diagnosis: smoldering myeloma    Cancer Staging  No matching staging information was found for the patient.   Prior Therapy: none  Current Therapy:  surveillance   HISTORY OF PRESENT ILLNESS:    Oncology History   No history exists.     INTERVAL HISTORY:   Walter Perez is a 67 y.o. male presenting to clinic today for follow up of smoldering myeloma. He was last seen by me on 02/28/22.  Today, he states that he is doing well overall. His appetite level is at 100%. His energy level is at 100%. He denies any new bone pains.  Reports upper respiratory infection with yellow-green expectoration for the last 3 to 4 days, not getting better.   PAST MEDICAL HISTORY:   Past Medical History: Past Medical History:  Diagnosis Date   Coronary artery disease    Hyperlipidemia    Hypertension    Myocardial infarction ([facility]) 06/19/2019    Surgical History: Past Surgical History:  Procedure Laterality Date   CORONARY ARTERY BYPASS GRAFT  06/19/2019   quadruple    Social History: Social History   Socioeconomic History   Marital status: Married    Spouse name: Not on file   Number of children: 2   Years of education: Not on file   Highest education level: Not on file  Occupational History   Occupation: Unemployed  Tobacco Use   Smoking status: Never   Smokeless tobacco: Never  Substance and Sexual Activity   Alcohol use: Never   Drug use: Never   Sexual activity: Yes  Other Topics Concern   Not on file  Social History Narrative   Not on file   Social Determinants of Health   Financial Resource Strain: Not on file  Food Insecurity: Not on file  Transportation Needs: Not on file  Physical Activity: Not on file  Stress: Not on file  Social Connections: Not on file  Intimate Partner Violence: Not At Risk (08/25/2020)   Humiliation, Afraid, Rape, and Kick questionnaire    Fear of Current or Ex-Partner: No    Emotionally Abused: No    Physically Abused: No    Sexually Abused: No    Family History: Family History  Problem Relation Age of Onset   Stroke Mother    Stroke Father    Kidney disease Brother    Alzheimer's disease Sister     Current  Medications:  Current Outpatient Medications:    amoxicillin-clavulanate (AUGMENTIN) 875-125 MG tablet, Take 1 tablet by mouth 2 (two) times daily., Disp: 14 tablet, Rfl: 0   aspirin 81 MG EC tablet, Take 81 mg by mouth daily. , Disp: , Rfl:    atorvastatin (LIPITOR) 80 MG tablet, TAKE 1 TABLET BY MOUTH ONCE DAILY AT  6  PM, Disp: 90 tablet, Rfl: 0   azelastine (ASTELIN) 0.1 % nasal spray, Place 2 sprays into both nostrils 2 (two) times daily., Disp: 30 mL, Rfl: 12   clopidogrel (PLAVIX) 75 MG tablet, Take 1  tablet by mouth once daily, Disp: 90 tablet, Rfl: 0   metoprolol succinate (TOPROL-XL) 100 MG 24 hr tablet, TAKE 1 TABLET BY MOUTH ONCE DAILY WITH OR IMMEDIATELY FOLLOWING A MEAL., Disp: 90 tablet, Rfl: 3   olmesartan-hydrochlorothiazide (BENICAR HCT) 40-25 MG tablet, Take 1 tablet by mouth once daily, Disp: 90 tablet, Rfl: 0   potassium chloride SA (KLOR-CON M) 20 MEQ tablet, Take 1 tablet (20 mEq total) by mouth daily., Disp: 90 tablet, Rfl: 3   triamcinolone cream (KENALOG) 0.1 %, Apply 1 application topically daily as needed., Disp: 453 g, Rfl: 3   nitroGLYCERIN (NITROSTAT) 0.4 MG SL tablet, Place 1 tablet (0.4 mg total) under the tongue every 5 (five) minutes as needed for chest pain. (Patient not taking: Reported on 09/07/2022), Disp: 25 tablet, Rfl: 3  Current Facility-Administered Medications:    triamcinolone acetonide (KENALOG-40) injection 40 mg, 40 mg, Intramuscular, Once, Lavonna Monarch, MD   Allergies: No Known Allergies  REVIEW OF SYSTEMS:   Review of Systems  Constitutional:  Negative for chills, fatigue and fever.  HENT:   Negative for lump/mass, mouth sores, nosebleeds, sore throat and trouble swallowing.   Eyes:  Negative for eye problems.  Respiratory:  Positive for cough. Negative for shortness of breath.   Cardiovascular:  Negative for chest pain, leg swelling and palpitations.  Gastrointestinal:  Negative for abdominal pain, constipation, diarrhea, nausea and  vomiting.  Genitourinary:  Negative for bladder incontinence, difficulty urinating, dysuria, frequency, hematuria and nocturia.   Musculoskeletal:  Negative for arthralgias, back pain, flank pain, myalgias and neck pain.  Skin:  Negative for itching and rash.  Neurological:  Negative for dizziness, headaches and numbness.  Hematological:  Does not bruise/bleed easily.  Psychiatric/Behavioral:  Negative for depression, sleep disturbance and suicidal ideas. The patient is not nervous/anxious.   All other systems reviewed and are negative.    VITALS:   Blood pressure (!) 137/90, pulse 77, temperature 98.5 F (36.9 C), temperature source Oral, resp. rate 16, weight 205 lb 11.2 oz (93.3 kg), SpO2 97 %.  Wt Readings from Last 3 Encounters:  09/07/22 205 lb 11.2 oz (93.3 kg)  03/09/22 198 lb 6.4 oz (90 kg)  02/28/22 197 lb 3.2 oz (89.4 kg)    Body mass index is 32.22 kg/m.  Performance status (ECOG): 1 - Symptomatic but completely ambulatory  PHYSICAL EXAM:   Physical Exam Vitals and nursing note reviewed. Exam conducted with a chaperone present.  Constitutional:      Appearance: Normal appearance.  Cardiovascular:     Rate and Rhythm: Normal rate and regular rhythm.     Pulses: Normal pulses.     Heart sounds: Normal heart sounds.  Pulmonary:     Effort: Pulmonary effort is normal.     Breath sounds: Normal breath sounds.  Abdominal:     Palpations: Abdomen is soft. There is no hepatomegaly, splenomegaly or mass.     Tenderness: There is no abdominal tenderness.  Musculoskeletal:     Right lower leg: No edema.     Left lower leg: No edema.  Lymphadenopathy:     Cervical: No cervical adenopathy.     Right cervical: No superficial, deep or posterior cervical adenopathy.    Left cervical: No superficial, deep or posterior cervical adenopathy.     Upper Body:     Right upper body: No supraclavicular or axillary adenopathy.     Left upper body: No supraclavicular or axillary  adenopathy.  Neurological:     General:  No focal deficit present.     Mental Status: He is alert and oriented to person, place, and time.  Psychiatric:        Mood and Affect: Mood normal.        Behavior: Behavior normal.     LABS:      Latest Ref Rng & Units 08/31/2022    2:22 PM 02/21/2022   10:51 AM 08/22/2021    9:45 AM  CBC  WBC 4.0 - 10.5 K/uL 6.4  5.3  6.1   Hemoglobin 13.0 - 17.0 g/dL 14.3  14.1  14.5   Hematocrit 39.0 - 52.0 % 42.5  41.2  44.4   Platelets 150 - 400 K/uL 164  163  181       Latest Ref Rng & Units 08/31/2022    2:22 PM 03/09/2022    9:40 AM 02/21/2022   10:51 AM  CMP  Glucose 70 - 99 mg/dL 95   104   BUN 8 - 23 mg/dL 23   15   Creatinine 0.61 - 1.24 mg/dL 1.42   1.09   Sodium 135 - 145 mmol/L 133   135   Potassium 3.5 - 5.1 mmol/L 3.1  4.4  2.9   Chloride 98 - 111 mmol/L 101   101   CO2 22 - 32 mmol/L 25   26   Calcium 8.9 - 10.3 mg/dL 8.3   8.5   Total Protein 6.5 - 8.1 g/dL 7.6   7.8   Total Bilirubin 0.3 - 1.2 mg/dL 0.7   1.3   Alkaline Phos 38 - 126 U/L 72   60   AST 15 - 41 U/L 25   25   ALT 0 - 44 U/L 24   25      No results found for: "CEA1", "CEA" / No results found for: "CEA1", "CEA" No results found for: "PSA1" No results found for: "[id number]" No results found for: "CAN125"  Lab Results  Component Value Date   TOTALPROTELP 7.2 08/31/2022   ALBUMINELP 3.9 08/31/2022   A1GS 0.2 08/31/2022   A2GS 0.6 08/31/2022   BETS 0.7 08/31/2022   GAMS 1.8 08/31/2022   MSPIKE 1.6 (H) 08/31/2022   SPEI Comment 08/31/2022   No results found for: "TIBC", "FERRITIN", "IRONPCTSAT" Lab Results  Component Value Date   LDH 168 08/31/2022   LDH 165 02/21/2022   LDH 157 08/22/2021     STUDIES:   DG Bone Survey Met  Result Date: 09/02/2022 CLINICAL DATA:  Small during myeloma EXAM: METASTATIC BONE SURVEY COMPARISON:  08/29/2021 FINDINGS: Metastatic bone survey was performed. Lateral view of the skull shows no lytic or sclerotic lesions. The upper  extremities show no lytic or sclerotic lesions. Cervical spine shows mild degenerative change without focal lytic lesions. Thoracic spine shows multilevel osteophytic change. No compression deformities are noted. No lytic lesions are seen. The lumbar spine shows no lytic or sclerotic lesions. Mild osteophytic changes are noted. No lytic lesions are noted in the ribcage or pelvis. Stable area of sclerosis in the upper right sacrum is noted. The lower extremities show no lytic or sclerotic lesions. IMPRESSION: No definitive lytic or sclerotic lesions to correspond with the given clinical history. Electronically Signed   By: Inez Catalina M.D.   On: 09/02/2022 22:24

## 2022-11-06 ENCOUNTER — Other Ambulatory Visit: Payer: Self-pay | Admitting: *Deleted

## 2022-11-06 MED ORDER — POTASSIUM CHLORIDE CRYS ER 20 MEQ PO TBCR: 20.0000 meq | EXTENDED_RELEASE_TABLET | Freq: Two times a day (BID) | ORAL | 3 refills | Status: AC

## 2022-11-22 ENCOUNTER — Other Ambulatory Visit: Payer: Self-pay | Admitting: Physician Assistant

## 2023-01-07 DIAGNOSIS — R6883 Chills (without fever): Secondary | ICD-10-CM | POA: Diagnosis not present

## 2023-01-07 DIAGNOSIS — Z20822 Contact with and (suspected) exposure to covid-19: Secondary | ICD-10-CM | POA: Diagnosis not present

## 2023-01-07 DIAGNOSIS — R0602 Shortness of breath: Secondary | ICD-10-CM | POA: Diagnosis not present

## 2023-01-07 DIAGNOSIS — R030 Elevated blood-pressure reading, without diagnosis of hypertension: Secondary | ICD-10-CM | POA: Diagnosis not present

## 2023-01-07 DIAGNOSIS — B349 Viral infection, unspecified: Secondary | ICD-10-CM | POA: Diagnosis not present

## 2023-01-25 DIAGNOSIS — D472 Monoclonal gammopathy: Secondary | ICD-10-CM | POA: Diagnosis not present

## 2023-01-25 DIAGNOSIS — L237 Allergic contact dermatitis due to plants, except food: Secondary | ICD-10-CM | POA: Diagnosis not present

## 2023-01-25 DIAGNOSIS — Z0000 Encounter for general adult medical examination without abnormal findings: Secondary | ICD-10-CM | POA: Diagnosis not present

## 2023-01-25 DIAGNOSIS — E782 Mixed hyperlipidemia: Secondary | ICD-10-CM | POA: Diagnosis not present

## 2023-01-25 DIAGNOSIS — Z951 Presence of aortocoronary bypass graft: Secondary | ICD-10-CM | POA: Diagnosis not present

## 2023-01-25 DIAGNOSIS — R7989 Other specified abnormal findings of blood chemistry: Secondary | ICD-10-CM | POA: Diagnosis not present

## 2023-01-25 DIAGNOSIS — I2510 Atherosclerotic heart disease of native coronary artery without angina pectoris: Secondary | ICD-10-CM | POA: Diagnosis not present

## 2023-01-25 DIAGNOSIS — I10 Essential (primary) hypertension: Secondary | ICD-10-CM | POA: Diagnosis not present

## 2023-01-25 DIAGNOSIS — E876 Hypokalemia: Secondary | ICD-10-CM | POA: Diagnosis not present

## 2023-01-25 DIAGNOSIS — R7309 Other abnormal glucose: Secondary | ICD-10-CM | POA: Diagnosis not present

## 2023-01-25 DIAGNOSIS — I9789 Other postprocedural complications and disorders of the circulatory system, not elsewhere classified: Secondary | ICD-10-CM | POA: Diagnosis not present

## 2023-01-25 DIAGNOSIS — I4891 Unspecified atrial fibrillation: Secondary | ICD-10-CM | POA: Diagnosis not present

## 2023-02-14 DIAGNOSIS — L308 Other specified dermatitis: Secondary | ICD-10-CM | POA: Diagnosis not present

## 2023-02-14 DIAGNOSIS — L218 Other seborrheic dermatitis: Secondary | ICD-10-CM | POA: Diagnosis not present

## 2023-02-18 ENCOUNTER — Other Ambulatory Visit: Payer: Self-pay | Admitting: Physician Assistant

## 2023-03-07 ENCOUNTER — Inpatient Hospital Stay: Payer: Medicare Other | Attending: Hematology

## 2023-03-07 DIAGNOSIS — E876 Hypokalemia: Secondary | ICD-10-CM | POA: Diagnosis not present

## 2023-03-07 DIAGNOSIS — I2510 Atherosclerotic heart disease of native coronary artery without angina pectoris: Secondary | ICD-10-CM | POA: Diagnosis not present

## 2023-03-07 DIAGNOSIS — D472 Monoclonal gammopathy: Secondary | ICD-10-CM | POA: Diagnosis not present

## 2023-03-07 DIAGNOSIS — Z951 Presence of aortocoronary bypass graft: Secondary | ICD-10-CM | POA: Diagnosis not present

## 2023-03-07 LAB — COMPREHENSIVE METABOLIC PANEL
ALT: 30 U/L (ref 0–44)
AST: 27 U/L (ref 15–41)
Albumin: 4.4 g/dL (ref 3.5–5.0)
Alkaline Phosphatase: 66 U/L (ref 38–126)
Anion gap: 12 (ref 5–15)
BUN: 18 mg/dL (ref 8–23)
CO2: 28 mmol/L (ref 22–32)
Calcium: 9.4 mg/dL (ref 8.9–10.3)
Chloride: 98 mmol/L (ref 98–111)
Creatinine, Ser: 1.1 mg/dL (ref 0.61–1.24)
GFR, Estimated: >60 mL/min (ref >60)
Glucose, Bld: 104 mg/dL — ABNORMAL HIGH (ref 70–99)
Potassium: 3.8 mmol/L (ref 3.5–5.1)
Sodium: 138 mmol/L (ref 135–145)
Total Bilirubin: 1.4 mg/dL — ABNORMAL HIGH (ref 0.3–1.2)
Total Protein: 8.7 g/dL — ABNORMAL HIGH (ref 6.5–8.1)

## 2023-03-07 LAB — CBC WITH DIFFERENTIAL/PLATELET
Abs Immature Granulocytes: 0.01 10*3/uL (ref 0.00–0.07)
Basophils Absolute: 0 10*3/uL (ref 0.0–0.1)
Basophils Relative: 1 %
Eosinophils Absolute: 0.3 10*3/uL (ref 0.0–0.5)
Eosinophils Relative: 5 %
HCT: 46.1 % (ref 39.0–52.0)
Hemoglobin: 15.3 g/dL (ref 13.0–17.0)
Immature Granulocytes: 0 %
Lymphocytes Relative: 24 %
Lymphs Abs: 1.5 10*3/uL (ref 0.7–4.0)
MCH: 28.9 pg (ref 26.0–34.0)
MCHC: 33.2 g/dL (ref 30.0–36.0)
MCV: 87.1 fL (ref 80.0–100.0)
Monocytes Absolute: 0.5 10*3/uL (ref 0.1–1.0)
Monocytes Relative: 8 %
Neutro Abs: 3.8 10*3/uL (ref 1.7–7.7)
Neutrophils Relative %: 62 %
Platelets: 176 10*3/uL (ref 150–400)
RBC: 5.29 MIL/uL (ref 4.22–5.81)
RDW: 13.1 % (ref 11.5–15.5)
WBC: 6.1 10*3/uL (ref 4.0–10.5)
nRBC: 0 % (ref 0.0–0.2)

## 2023-03-08 LAB — KAPPA/LAMBDA LIGHT CHAINS
Kappa free light chain: 11 mg/L (ref 3.3–19.4)
Kappa, lambda light chain ratio: 0.06 — ABNORMAL LOW (ref 0.26–1.65)
Lambda free light chains: 194.3 mg/L — ABNORMAL HIGH (ref 5.7–26.3)

## 2023-03-09 ENCOUNTER — Inpatient Hospital Stay: Payer: Medicaid Other

## 2023-03-09 LAB — PROTEIN ELECTROPHORESIS, SERUM
A/G Ratio: 1 (ref 0.7–1.7)
Albumin ELP: 4 g/dL (ref 2.9–4.4)
Alpha-1-Globulin: 0.2 g/dL (ref 0.0–0.4)
Alpha-2-Globulin: 0.7 g/dL (ref 0.4–1.0)
Beta Globulin: 0.9 g/dL (ref 0.7–1.3)
Gamma Globulin: 2.1 g/dL — ABNORMAL HIGH (ref 0.4–1.8)
Globulin, Total: 4 g/dL — ABNORMAL HIGH (ref 2.2–3.9)
M-Spike, %: 1.9 g/dL — ABNORMAL HIGH
Total Protein ELP: 8 g/dL (ref 6.0–8.5)

## 2023-03-12 NOTE — Progress Notes (Unsigned)
Cardiology Office Note   Date:  03/12/2023   ID:  Walter Perez, DOB Feb 16, 1956, MRN [id number]  PCP:  Bary Leriche, PA-C  Cardiologist:   Dietrich Pates, MD   Pt presents for f/u of CAD      History of Present Illness: Walter Perez is a 67 y.o. male with a history of CAD :Myovjue in Nov 2020 showed anteroapical ischemia   Cath showed 3 V CAD; On 06/19/19 he underwent CABG x 4 (SVG to  PDA and distal lCx; RIMA to LAD and OM1 as Y graft from LIMA.  PDA endarterectomy)  Post op he had some Afib  Finished 30 days of AMiodarone     The pt also hsa a hx of HL, HTN, MBGUS    I saw the pt in clinic in Feb 2022  Since seen he has done OK   Deneis CP   Breathing is OK  No dizziness    Does have some numbness in both hands with driving   I saw the pt in April 2023  No outpatient medications have been marked as taking for the 03/14/23 encounter (Appointment) with Pricilla Riffle, MD.   Current Facility-Administered Medications for the 03/14/23 encounter (Appointment) with Pricilla Riffle, MD  Medication   triamcinolone acetonide (KENALOG-40) injection 40 mg     Allergies:   Patient has no known allergies.   Past Medical History:  Diagnosis Date   Coronary artery disease    Hyperlipidemia    Hypertension    Myocardial infarction ([facility]) 06/19/2019    Past Surgical History:  Procedure Laterality Date   CORONARY ARTERY BYPASS GRAFT  06/19/2019   quadruple     Social History:  The patient  reports that he has never smoked. He has never used smokeless tobacco. He reports that he does not drink alcohol and does not use drugs.   Family History:  The patient's family history includes Alzheimer's disease in his sister; Kidney disease in his brother; Stroke in his father and mother.    ROS:  Please see the history of present illness. All other systems are reviewed and  Negative to the above problem except as noted.    PHYSICAL EXAM: VS:  There were no vitals taken for this visit.  GEN:  OBese 67 yo n no acute distress  HEENT: normal  Neck: no JVD, carotid bruits, Cardiac: RRR; no murmurs,   NO LE  edema  Respiratory:  clear to auscultation bilaterally,  GI: soft, nontender, nondistended, + BS  No hepatomegaly  MS: no deformity Moving all extremities   Skin: warm and dry, no rash Neuro:  Strength and sensation are intact Psych: euthymic mood, full affect   EKG:  EKG is ordered today.  NSR 63 bpm     Lipid Panel    Component Value Date/Time   CHOL 131 08/06/2020 1607   TRIG 140 08/06/2020 1607   HDL 40 08/06/2020 1607   CHOLHDL 3.3 08/06/2020 1607   LDLCALC 66 08/06/2020 1607      Wt Readings from Last 3 Encounters:  09/07/22 205 lb 11.2 oz (93.3 kg)  03/09/22 198 lb 6.4 oz (90 kg)  02/28/22 197 lb 3.2 oz (89.4 kg)      ASSESSMENT AND PLAN:   1  CAD Pt s/p CABG ins 2020 at [facility]  No symptoms of angina   Keep on same meds   2 HL   Will check lipomed   3  HTN  BP is high here in clini  He says it is always high in doctors office "white coat " effect.   HE says it is better at home when he is relaxed .  Says his systolic pressures are in the  120s   I would recomm he bring in to next MD visit to compare    4  Diet  Discussed sugar intake LImit     Get labs from Dr Ks office     F/U next winter    Current medicines are reviewed at length with the patient today.  The patient does not have concerns regarding medicines.  Signed, Dietrich Pates, MD  03/12/2023 9:00 PM    [facility] [address] [place], [place], [place]  [address] Phone: [phone]; Fax: [phone]

## 2023-03-14 ENCOUNTER — Ambulatory Visit: Payer: Medicare Other | Attending: Internal Medicine | Admitting: Internal Medicine

## 2023-03-14 ENCOUNTER — Encounter: Payer: Self-pay | Admitting: Internal Medicine

## 2023-03-14 VITALS — BP 170/110 | HR 63 | Ht 67.0 in | Wt 205.8 lb

## 2023-03-14 DIAGNOSIS — E782 Mixed hyperlipidemia: Secondary | ICD-10-CM

## 2023-03-14 DIAGNOSIS — Z79899 Other long term (current) drug therapy: Secondary | ICD-10-CM

## 2023-03-14 NOTE — Patient Instructions (Signed)
Medication Instructions:  Your physician recommends that you continue on your current medications as directed. Please refer to the Current Medication list given to you today.  *If you need a refill on your cardiac medications before your next appointment, please call your pharmacy*  Lab Work: TODAY: Lipomed, BMET, CBC, and HgbA1C  Follow-Up: At [facility], you and your health needs are our priority.  As part of our continuing mission to provide you with exceptional heart care, we have created designated Provider Care Teams.  These Care Teams include your primary Cardiologist (physician) and Advanced Practice Providers (APPs -  Physician Assistants and Nurse Practitioners) who all work together to provide you with the care you need, when you need it.  Your next appointment:   2-3 week with PharmD for hypertension  7 months with Dr. Tenny Craw

## 2023-03-15 ENCOUNTER — Inpatient Hospital Stay (HOSPITAL_BASED_OUTPATIENT_CLINIC_OR_DEPARTMENT_OTHER): Payer: Medicare Other | Admitting: Hematology

## 2023-03-15 VITALS — BP 162/99 | HR 66 | Temp 98.2°F | Resp 17 | Ht 67.0 in | Wt 206.4 lb

## 2023-03-15 DIAGNOSIS — Z951 Presence of aortocoronary bypass graft: Secondary | ICD-10-CM | POA: Diagnosis not present

## 2023-03-15 DIAGNOSIS — D472 Monoclonal gammopathy: Secondary | ICD-10-CM

## 2023-03-15 DIAGNOSIS — I2510 Atherosclerotic heart disease of native coronary artery without angina pectoris: Secondary | ICD-10-CM | POA: Diagnosis not present

## 2023-03-15 DIAGNOSIS — E876 Hypokalemia: Secondary | ICD-10-CM | POA: Diagnosis not present

## 2023-03-15 LAB — BASIC METABOLIC PANEL
BUN/Creatinine Ratio: 11 (ref 10–24)
BUN: 13 mg/dL (ref 8–27)
CO2: 26 mmol/L (ref 20–29)
Calcium: 9.3 mg/dL (ref 8.6–10.2)
Chloride: 98 mmol/L (ref 96–106)
Creatinine, Ser: 1.14 mg/dL (ref 0.76–1.27)
Glucose: 112 mg/dL — ABNORMAL HIGH (ref 70–99)
Potassium: 3.6 mmol/L (ref 3.5–5.2)
Sodium: 136 mmol/L (ref 134–144)
eGFR: 71 mL/min/{1.73_m2} (ref >59)

## 2023-03-15 LAB — HEMOGLOBIN A1C
Est. average glucose Bld gHb Est-mCnc: 134 mg/dL
Hgb A1c MFr Bld: 6.3 % — ABNORMAL HIGH (ref 4.8–5.6)

## 2023-03-15 LAB — CBC
Hematocrit: 44.4 % (ref 37.5–51.0)
Hemoglobin: 15 g/dL (ref 13.0–17.7)
MCH: 29.2 pg (ref 26.6–33.0)
MCHC: 33.8 g/dL (ref 31.5–35.7)
MCV: 87 fL (ref 79–97)
Platelets: 171 10*3/uL (ref 150–450)
RBC: 5.13 x10E6/uL (ref 4.14–5.80)
RDW: 13.2 % (ref 11.6–15.4)
WBC: 6.3 10*3/uL (ref 3.4–10.8)

## 2023-03-15 LAB — NMR, LIPOPROFILE
Cholesterol, Total: 115 mg/dL (ref 100–199)
HDL Particle Number: 23.8 umol/L — ABNORMAL LOW (ref >=30.5)
HDL-C: 35 mg/dL — ABNORMAL LOW (ref >39)
LDL Particle Number: 856 nmol/L (ref <1000)
LDL Size: 20.5 nm — ABNORMAL LOW (ref >20.5)
LDL-C (NIH Calc): 62 mg/dL (ref 0–99)
LP-IR Score: 63 — ABNORMAL HIGH (ref <=45)
Small LDL Particle Number: 315 nmol/L (ref <=527)
Triglycerides: 93 mg/dL (ref 0–149)

## 2023-03-15 NOTE — Progress Notes (Signed)
[facility] [address] [place] [address]    Clinic Day:  03/15/2023  Referring physician: Allwardt, Crist Infante, PA-C  Patient Care Team: Allwardt, Milus Mallick as PCP - General (Physician Assistant) Pricilla Riffle, MD as PCP - Cardiology (Cardiology) Janalyn Harder, MD (Inactive) as Consulting Physician (Dermatology)   ASSESSMENT & PLAN:   Assessment: 1.  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  Lambda light chains 303, ratio of 0.03.  LDH normal.  Beta-2 microglobulin 1.7.  Serum viscosity normal.  Hemoglobin, calcium and creatinine normal. -Bone marrow biopsy on 04/30/2019 showed atypical plasma cells representing 12% in the aspirate.  Plasma cells display lambda light chain restriction. -Myeloma FISH panel shows gain of 1 q.  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 -24-hour urine negative for immunofixation and UPEP.  Urine total protein was undetectable. -Skeletal survey on 05/09/2019 did not show any evidence of lytic lesions.  Insurance refused PET CT scan. -MRI of the thoracic, lumbar spine and pelvis with and without contrast on 06/18/2019 did not show any evidence of myeloma. -Based on Mayo 2018 risk stratification system, his M spike is more than 2 g/dL.  Involved/uninvolved free light chain ratio was more than 20.  Bone marrow plasma cells were less than 20%.  With 2 risk factors, he was considered high risk with estimated median time to progression of 29 months, estimated risk of progression of 24 %/year during first 2 years, 11 %/year for the next 3 years, 3 %/year for the next 5 years.   2.  CAD: -Status post CABG at [facility] in December 2020.  Plan:  1.  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 - Denies any infections in the past 6 months. - Reviewed labs from 03/07/2023: Normal calcium and creatinine.  M spike is 1.9 g, up from 1.6 g  previously.  Hemoglobin normal at 15.3.  Lambda light chains are 194 and ratio is 0.06 and stable. - No "crab" features to warrant treatment at this time. - RTC 6 months for follow-up with labs.  Will also do skeletal survey prior to next visit.   2.  Hypokalemia: - Continue potassium supplements at home.  Potassium is normal at 3.8.  Orders Placed This Encounter  Procedures   DG Bone Survey Met    Standing Status:   Future    Standing Expiration Date:   03/14/2024    Order Specific Question:   Reason for Exam (SYMPTOM  OR DIAGNOSIS REQUIRED)    Answer:   Smoldering Myeloma    Order Specific Question:   Preferred imaging location?    Answer:   [facility]    Order Specific Question:   Release to patient    Answer:   Immediate   Protein electrophoresis, serum    Standing Status:   Future    Standing Expiration Date:   03/14/2024    Order Specific Question:   Release to patient    Answer:   Immediate   Kappa/lambda light chains    Standing Status:   Future    Standing Expiration Date:   03/14/2024   CBC with Differential/Platelet    Standing Status:   Future    Standing Expiration Date:   03/14/2024    Order Specific Question:   Release to patient    Answer:   Immediate   Comprehensive metabolic  panel    Standing Status:   Future    Standing Expiration Date:   03/14/2024    Order Specific Question:   Release to patient    Answer:   Immediate   Lactate dehydrogenase    Standing Status:   Future    Standing Expiration Date:   03/14/2024    Order Specific Question:   Release to patient    Answer:   Immediate   Beta 2 microglobulin, serum    Standing Status:   Future    Standing Expiration Date:   03/14/2024      Alben Deeds Teague,acting as a scribe for Doreatha Massed, MD.,have documented all relevant documentation on the behalf of Doreatha Massed, MD,as directed by  Doreatha Massed, MD while in the presence of Doreatha Massed, MD.  I, Doreatha Massed  MD, have reviewed the above documentation for accuracy and completeness, and I agree with the above.    Doreatha Massed, MD   9/12/20243:59 PM  CHIEF COMPLAINT:   Diagnosis: smoldering myeloma    Cancer Staging  No matching staging information was found for the patient.    Prior Therapy: none  Current Therapy:  surveillance   HISTORY OF PRESENT ILLNESS:   Oncology History   No history exists.     INTERVAL HISTORY:   Walter Perez is a 67 y.o. male presenting to clinic today for follow up of smoldering myeloma. He was last seen by me on 09/07/22.  Today, he states that he is doing well overall. His appetite level is at 100%. His energy level is at 100%.  He denies any new bone or back pains, or infections. He is taking Potassium as prescribed.  PAST MEDICAL HISTORY:   Past Medical History: Past Medical History:  Diagnosis Date   Coronary artery disease    Hyperlipidemia    Hypertension    Myocardial infarction (HCC) 06/19/2019    Surgical History: Past Surgical History:  Procedure Laterality Date   CORONARY ARTERY BYPASS GRAFT  06/19/2019   quadruple    Social History: Social History   Socioeconomic History   Marital status: Married    Spouse name: Not on file   Number of children: 2   Years of education: Not on file   Highest education level: Not on file  Occupational History   Occupation: Unemployed  Tobacco Use   Smoking status: Never   Smokeless tobacco: Never  Substance and Sexual Activity   Alcohol use: Never   Drug use: Never   Sexual activity: Yes  Other Topics Concern   Not on file  Social History Narrative   Not on file   Social Determinants of Health   Financial Resource Strain: Not on file  Food Insecurity: Low Risk  (01/18/2023)   Received from [facility]   Hunger Vital Sign    Worried About Running Out of Food in the Last Year: Never true    Ran Out of Food in the Last Year: Never true  Transportation Needs: Not on file  (01/18/2023)  Physical Activity: Not on file  Stress: Not on file  Social Connections: Not on file  Intimate Partner Violence: Unknown (10/03/2021)   [facility]   HITS    Physically Hurt: Not on file    Insult or Talk Down To: Not on file    Threaten Physical Harm: Not on file    Scream or Curse: Not on file    Family History: Family History  Problem Relation  Age of Onset   Stroke Mother    Stroke Father    Kidney disease Brother    Alzheimer's disease Sister     Current Medications:  Current Outpatient Medications:    amoxicillin-clavulanate (AUGMENTIN) 875-125 MG tablet, Take 1 tablet by mouth 2 (two) times daily., Disp: 14 tablet, Rfl: 0   aspirin 81 MG EC tablet, Take 81 mg by mouth daily. , Disp: , Rfl:    atorvastatin (LIPITOR) 80 MG tablet, TAKE 1 TABLET BY MOUTH ONCE DAILY AT  6 PM, Disp: 90 tablet, Rfl: 0   azelastine (ASTELIN) 0.1 % nasal spray, Place 2 sprays into both nostrils 2 (two) times daily., Disp: 30 mL, Rfl: 12   clopidogrel (PLAVIX) 75 MG tablet, Take 1 tablet by mouth once daily, Disp: 90 tablet, Rfl: 0   metoprolol succinate (TOPROL-XL) 100 MG 24 hr tablet, TAKE 1 TABLET BY MOUTH ONCE DAILY WITH OR IMMEDIATELY FOLLOWING A MEAL., Disp: 90 tablet, Rfl: 3   nitroGLYCERIN (NITROSTAT) 0.4 MG SL tablet, Place 1 tablet (0.4 mg total) under the tongue every 5 (five) minutes as needed for chest pain., Disp: 25 tablet, Rfl: 3   olmesartan-hydrochlorothiazide (BENICAR HCT) 40-25 MG tablet, Take 1 tablet by mouth once daily, Disp: 90 tablet, Rfl: 0   potassium chloride SA (KLOR-CON M) 20 MEQ tablet, Take 1 tablet (20 mEq total) by mouth 2 (two) times daily., Disp: 60 tablet, Rfl: 3   triamcinolone cream (KENALOG) 0.1 %, Apply 1 application topically daily as needed., Disp: 453 g, Rfl: 3  Current Facility-Administered Medications:    triamcinolone acetonide (KENALOG-40) injection 40 mg, 40 mg, Intramuscular, Once, Janalyn Harder, MD    Allergies: No Known Allergies  REVIEW OF SYSTEMS:   Review of Systems  Constitutional:  Negative for chills, fatigue and fever.  HENT:   Negative for lump/mass, mouth sores, nosebleeds, sore throat and trouble swallowing.   Eyes:  Negative for eye problems.  Respiratory:  Negative for cough and shortness of breath.   Cardiovascular:  Negative for chest pain, leg swelling and palpitations.  Gastrointestinal:  Negative for abdominal pain, constipation, diarrhea, nausea and vomiting.  Genitourinary:  Negative for bladder incontinence, difficulty urinating, dysuria, frequency, hematuria and nocturia.   Musculoskeletal:  Negative for arthralgias, back pain, flank pain, myalgias and neck pain.  Skin:  Negative for itching and rash.  Neurological:  Negative for dizziness, headaches and numbness.  Hematological:  Does not bruise/bleed easily.  Psychiatric/Behavioral:  Positive for sleep disturbance. Negative for depression and suicidal ideas. The patient is not nervous/anxious.   All other systems reviewed and are negative.    VITALS:   Blood pressure (!) 162/99, pulse 66, temperature 98.2 F (36.8 C), temperature source Oral, resp. rate 17, height 5\' 7"  (1.702 m), weight 206 lb 6.4 oz (93.6 kg), SpO2 99%.  Wt Readings from Last 3 Encounters:  03/15/23 206 lb 6.4 oz (93.6 kg)  03/14/23 205 lb 12.8 oz (93.4 kg)  09/07/22 205 lb 11.2 oz (93.3 kg)    Body mass index is 32.33 kg/m.  Performance status (ECOG): 1 - Symptomatic but completely ambulatory  PHYSICAL EXAM:   Physical Exam Vitals and nursing note reviewed. Exam conducted with a chaperone present.  Constitutional:      Appearance: Normal appearance.  Cardiovascular:     Rate and Rhythm: Normal rate and regular rhythm.     Pulses: Normal pulses.     Heart sounds: Normal heart sounds.  Pulmonary:     Effort: Pulmonary effort  is normal.     Breath sounds: Normal breath sounds.  Abdominal:     Palpations: Abdomen is soft.  There is no hepatomegaly, splenomegaly or mass.     Tenderness: There is no abdominal tenderness.  Musculoskeletal:     Right lower leg: No edema.     Left lower leg: No edema.  Lymphadenopathy:     Cervical: No cervical adenopathy.     Right cervical: No superficial, deep or posterior cervical adenopathy.    Left cervical: No superficial, deep or posterior cervical adenopathy.     Upper Body:     Right upper body: No supraclavicular or axillary adenopathy.     Left upper body: No supraclavicular or axillary adenopathy.  Neurological:     General: No focal deficit present.     Mental Status: He is alert and oriented to person, place, and time.  Psychiatric:        Mood and Affect: Mood normal.        Behavior: Behavior normal.     LABS:      Latest Ref Rng & Units 03/14/2023   10:29 AM 03/07/2023    8:18 AM 08/31/2022    2:22 PM  CBC  WBC 3.4 - 10.8 x10E3/uL 6.3  6.1  6.4   Hemoglobin 13.0 - 17.7 g/dL 82.9  56.2  13.0   Hematocrit 37.5 - 51.0 % 44.4  46.1  42.5   Platelets 150 - 450 x10E3/uL 171  176  164       Latest Ref Rng & Units 03/14/2023   10:29 AM 03/07/2023    8:18 AM 09/07/2022    2:51 PM  CMP  Glucose 70 - 99 mg/dL 865  784  96   BUN 8 - 27 mg/dL 13  18  20    Creatinine 0.76 - 1.27 mg/dL 6.96  2.95  2.84   Sodium 134 - 144 mmol/L 136  138  135   Potassium 3.5 - 5.2 mmol/L 3.6  3.8  3.3   Chloride 96 - 106 mmol/L 98  98  98   CO2 20 - 29 mmol/L 26  28  29    Calcium 8.6 - 10.2 mg/dL 9.3  9.4  8.6   Total Protein 6.5 - 8.1 g/dL  8.7    Total Bilirubin 0.3 - 1.2 mg/dL  1.4    Alkaline Phos 38 - 126 U/L  66    AST 15 - 41 U/L  27    ALT 0 - 44 U/L  30       No results found for: "CEA1", "CEA" / No results found for: "CEA1", "CEA" No results found for: "PSA1" No results found for: "XLK440" No results found for: "CAN125"  Lab Results  Component Value Date   TOTALPROTELP 8.0 03/07/2023   ALBUMINELP 4.0 03/07/2023   A1GS 0.2 03/07/2023   A2GS 0.7 03/07/2023    BETS 0.9 03/07/2023   GAMS 2.1 (H) 03/07/2023   MSPIKE 1.9 (H) 03/07/2023   SPEI Comment 03/07/2023   No results found for: "TIBC", "FERRITIN", "IRONPCTSAT" Lab Results  Component Value Date   LDH 168 08/31/2022   LDH 165 02/21/2022   LDH 157 08/22/2021     STUDIES:   No results found.

## 2023-03-15 NOTE — Patient Instructions (Signed)
[facility]  Discharge Instructions  You were seen and examined today by Dr. Ellin Saba.  Dr. Ellin Saba discussed your most recent lab work which revealed that everything looks good and stable.  Follow-up as scheduled in 6 months.    Thank you for choosing [facility] to provide your oncology and hematology care.   To afford each patient quality time with our provider, please arrive at least 15 minutes before your scheduled appointment time. You may need to reschedule your appointment if you arrive late (10 or more minutes). Arriving late affects you and other patients whose appointments are after yours.  Also, if you miss three or more appointments without notifying the office, you may be dismissed from the clinic at the provider's discretion.    Again, thank you for choosing [facility].  Our hope is that these requests will decrease the amount of time that you wait before being seen by our physicians.   If you have a lab appointment with the Cancer Center - please note that after April 8th, all labs will be drawn in the cancer center.  You do not have to check in or register with the main entrance as you have in the past but will complete your check-in at the cancer center.            _____________________________________________________________  Should you have questions after your visit to [facility], please contact our office at [phone] and follow the prompts.  Our office hours are 8:00 a.m. to 4:30 p.m. Monday - Thursday and 8:00 a.m. to 2:30 p.m. Friday.  Please note that voicemails left after 4:00 p.m. may not be returned until the following business day.  We are closed weekends and all major holidays.  You do have access to a nurse 24-7, just call the main number to the clinic [phone] and do not press any options, hold on the line and a nurse will answer the phone.    For prescription  refill requests, have your pharmacy contact our office and allow 72 hours.    Masks are no longer required in the cancer centers. If you would like for your care team to wear a mask while they are taking care of you, please let them know. You may have one support person who is at least 65 years old accompany you for your appointments.

## 2023-03-21 DIAGNOSIS — Z951 Presence of aortocoronary bypass graft: Secondary | ICD-10-CM | POA: Diagnosis not present

## 2023-03-21 DIAGNOSIS — E782 Mixed hyperlipidemia: Secondary | ICD-10-CM | POA: Diagnosis not present

## 2023-03-21 DIAGNOSIS — I9789 Other postprocedural complications and disorders of the circulatory system, not elsewhere classified: Secondary | ICD-10-CM | POA: Diagnosis not present

## 2023-03-21 DIAGNOSIS — I2510 Atherosclerotic heart disease of native coronary artery without angina pectoris: Secondary | ICD-10-CM | POA: Diagnosis not present

## 2023-03-21 DIAGNOSIS — I10 Essential (primary) hypertension: Secondary | ICD-10-CM | POA: Diagnosis not present

## 2023-03-21 DIAGNOSIS — D472 Monoclonal gammopathy: Secondary | ICD-10-CM | POA: Diagnosis not present

## 2023-03-21 DIAGNOSIS — Z0000 Encounter for general adult medical examination without abnormal findings: Secondary | ICD-10-CM | POA: Diagnosis not present

## 2023-03-21 DIAGNOSIS — Z23 Encounter for immunization: Secondary | ICD-10-CM | POA: Diagnosis not present

## 2023-03-21 DIAGNOSIS — E876 Hypokalemia: Secondary | ICD-10-CM | POA: Diagnosis not present

## 2023-03-21 DIAGNOSIS — I4891 Unspecified atrial fibrillation: Secondary | ICD-10-CM | POA: Diagnosis not present

## 2023-03-28 ENCOUNTER — Ambulatory Visit: Payer: Medicare Other | Attending: Cardiology | Admitting: Pharmacist

## 2023-03-28 VITALS — BP 172/100 | HR 65

## 2023-03-28 DIAGNOSIS — I10 Essential (primary) hypertension: Secondary | ICD-10-CM | POA: Diagnosis not present

## 2023-03-28 MED ORDER — NITROGLYCERIN 0.4 MG SL SUBL: 0.4000 mg | SUBLINGUAL_TABLET | SUBLINGUAL | 3 refills | Status: AC | PRN

## 2023-03-28 MED ORDER — AMLODIPINE BESYLATE 2.5 MG PO TABS: 2.5000 mg | ORAL_TABLET | Freq: Every day | ORAL | 3 refills | Status: DC

## 2023-03-28 NOTE — Progress Notes (Addendum)
Patient ID: TERREZ ROSALES                 DOB: 1956/01/14                      MRN: [id number]      HPI: Walter Perez is a 68 y.o. male referred by Dr. Tenny Craw to HTN clinic. PMH is significant for CAD :Myovjue in Nov 2020 showed anteroapical ischemia Cath showed 3 V CAD; On 06/19/19 he underwent CABG x 4 (SVG to PDA and distal lCx; RIMA to LAD and OM1 as Y graft from LIMA. PDA endarterectomy) Post op he had some Afib. PHM also significant for HTN, HL and smoldering myeloma . He was seen by Dr. Tenny Craw 03/14/23. BO in office was 170/110. Patient reported that he did not take him meds that AM. Also reported home readings 140/80. He was asked to bring cuff to HTN clinic visit. BP at PCP was 158/108 on 9/18. At his last visit, he brought in his home BP cuff and it was confirmed to be accurate based on comparison with the office BP cuff. With his blood pressures being elevated still (150-160/90-100) he was started on amlodipine 2.5 mg daily.  At this visit, patient was followed after starting his amlodipine. He denied experiencing any light headedness, blurry vision, or flushing. He confirms strong adherence with his current regimen and this aligns well with his fill history.   Clinic cuff: 138/84 Clinic cuff: 140/88  Current HTN meds: amlodipine 2.5 mg daily, metoprolol succinate 100mg  daily, olmesartan/hydrochlorothiazide 40/25mg  daily  Previously tried:  BP goal: <130/80  Family History: The patient's family history includes Alzheimer's disease in his sister; Kidney disease in his brother; Stroke in his father and mother.   Social History: no ETOH, no tobacco  Diet: cut out coke and sweet tea after last apt Hot tea- 1 cup a day Breakfast: none Lunch: stir fry chicken and veggies, egg sandwich  Dinner: stir fry chicken and veggies  Rarely eats desserts Rarely eats out  Exercise: walks 1 mile daily with dog and has [employer]  Home BP readings:  Mostly in 115-120/70-75  Wt Readings  from Last 3 Encounters:  03/15/23 206 lb 6.4 oz (93.6 kg)  03/14/23 205 lb 12.8 oz (93.4 kg)  09/07/22 205 lb 11.2 oz (93.3 kg)   BP Readings from Last 3 Encounters:  03/28/23 (!) 172/100  03/15/23 (!) 162/99  03/14/23 (!) 170/110   Pulse Readings from Last 3 Encounters:  03/28/23 65  03/15/23 66  03/14/23 63    Renal function: Estimated Creatinine Clearance: 70.4 mL/min (by C-G formula based on SCr of 1.14 mg/dL).  Past Medical History:  Diagnosis Date   Coronary artery disease    Hyperlipidemia    Hypertension    Myocardial infarction (HCC) 06/19/2019    Current Outpatient Medications on File Prior to Visit  Medication Sig Dispense Refill   aspirin 81 MG EC tablet Take 81 mg by mouth daily.      atorvastatin (LIPITOR) 80 MG tablet TAKE 1 TABLET BY MOUTH ONCE DAILY AT  6 PM 90 tablet 0   clopidogrel (PLAVIX) 75 MG tablet Take 1 tablet by mouth once daily 90 tablet 0   metoprolol succinate (TOPROL-XL) 100 MG 24 hr tablet TAKE 1 TABLET BY MOUTH ONCE DAILY WITH OR IMMEDIATELY FOLLOWING A MEAL. 90 tablet 3   olmesartan-hydrochlorothiazide (BENICAR HCT) 40-25 MG tablet Take 1 tablet by mouth once daily  90 tablet 0   potassium chloride SA (KLOR-CON M) 20 MEQ tablet Take 1 tablet (20 mEq total) by mouth 2 (two) times daily. (Patient taking differently: Take 20 mEq by mouth 3 (three) times daily.) 60 tablet 3   triamcinolone cream (KENALOG) 0.1 % Apply 1 application topically daily as needed. 453 g 3   azelastine (ASTELIN) 0.1 % nasal spray Place 2 sprays into both nostrils 2 (two) times daily. 30 mL 12   Current Facility-Administered Medications on File Prior to Visit  Medication Dose Route Frequency Provider Last Rate Last Admin   triamcinolone acetonide (KENALOG-40) injection 40 mg  40 mg Intramuscular Once Janalyn Harder, MD        No Known Allergies   Assessment/Plan:  1. Hypertension -  Assessment: Blood pressure at home well controlled and below goal of  <130/80 Blood pressures in the office were a little elevated at 138/84 and 140/88 but this is likely due to 'white-coat' HTN which is consistent with previous visits Patient has made appropriate adjustments to his diet that were discussed (cutting out Coke and sweet tea)  Plan: Continue amlodipine 2.5 mg daily, metoprolol 100 mg daily and olmesartan/hydrochlorothiazide 40/25 mg daily Sent refills for all BP medications and clopidogrel Having patient continue to periodically check BP at home and call the clinic if there are any major changes Follow-up as needed    Wilmer Floor, PharmD PGY2 Cardiology Pharmacy Resident   Thank you  Olene Floss, Pharm.D, BCACP, BCPS, CPP [facility] [address] [place], [place] [address]  Phone: [phone]; Fax: [phone]

## 2023-03-28 NOTE — Assessment & Plan Note (Signed)
Assessment: Blood pressure significantly elevated in clinic above goal less than 130/80 Home blood pressures are quite variable.  Occasional reading at goal but mainly 140s over 90s or even as high as 150s over 100s He has cut out Coke and sweet tea since his appointment with Dr. Tenny Craw We reviewed his diet and sodium recommendations.  I have asked him to log his sodium intake for a day or 2 to get the sense if he is exceeding the 2000 mg recommendation.  Plan: Start amlodipine 2.5 mg daily Continue metoprolol 100 mg daily and olmesartan/hydrochlorothiazide 40/25 mg daily Follow-up in 2 weeks

## 2023-03-28 NOTE — Patient Instructions (Addendum)
Your blood pressure goal is < 130/80mmHg   Please START amlodipine 2.5mg  daily Continue metoprolol succinate 100mg  daily, olmesartan/hydrochlorothiazide 40/25mg  daily   Important lifestyle changes to control high blood pressure  Intervention  Effect on the BP   Weight loss Weight loss is one of the most effective lifestyle changes for controlling blood pressure. If you're overweight or obese, losing even a small amount of weight can help reduce blood pressure.    Blood pressure can decrease by 1 millimeter of mercury (mmHg) with each kilogram (about 2.2 pounds) of weight lost.   Exercise regularly As a general goal, aim for 30 minutes of moderate physical activity every day.    Regular physical activity can lower blood pressure by 5 - 8 mmHg.   Eat a healthy diet Eat a diet rich in whole grains, fruits, vegetables, lean meat, and low-fat dairy products. Limit processed foods, saturated fat, and sweets.    A heart-healthy diet can lower high blood pressure by 10 mmHg.   Reduce salt (sodium) in your diet Aim for 000mg  of sodium each day. Avoid deli meats, canned food, and frozen microwave meals which are high in sodium.     Limiting sodium can reduce blood pressure by 5 mmHg.   Limit alcohol One drink equals 12 ounces of beer, 5 ounces of wine, or 1.5 ounces of 80-proof liquor.    Limiting alcohol to < 1 drink a day for women or < 2 drinks a day for men can help lower blood pressure by about 4 mmHg.   To check your pressure at home you will need to:   Sit up in a chair, with feet flat on the floor and back supported. Do not cross your ankles or legs. Rest your left arm so that the cuff is about heart level. If the cuff goes on your upper arm, then just relax your arm on the table, arm of the chair, or your lap. If you have a wrist cuff, hold your wrist against your chest at heart level. Place the cuff snugly around your arm, about 1 inch above the crease of your elbow. The  cords should be inside the groove of your elbow.  Sit quietly, with the cuff in place, for about 5 minutes. Then press the power button to start a reading. Do not talk or move while the reading is taking place.  Record your readings on a sheet of paper. Although most cuffs have a memory, it is often easier to see a pattern developing when the numbers are all in front of you.  You can repeat the reading after 1-3 minutes if it is recommended.   Make sure your bladder is empty and you have not had caffeine or tobacco within the last 30 minutes   Always bring your blood pressure log with you to your appointments. If you have not brought your monitor in to be double checked for accuracy, please bring it to your next appointment.   You can find a list of validated (accurate) blood pressure cuffs at: validatebp.org

## 2023-03-28 NOTE — Progress Notes (Incomplete Revision)
Patient ID: [name]                 DOB: 30-Apr-1956                      MRN: [id number]      HPI: Walter Perez is a 67 y.o. male referred by Dr. Tenny Craw to HTN clinic. PMH is significant for CAD :Myovjue in Nov 2020 showed anteroapical ischemia Cath showed 3 V CAD; On 06/19/19 he underwent CABG x 4 (SVG to PDA and distal lCx; RIMA to LAD and OM1 as Y graft from LIMA. PDA endarterectomy) Post op he had some Afib. PMH also significant for HTN, HL and smoldering myeloma . He was seen by Dr. Tenny Craw 03/14/23. BP in office was 170/110. Patient reported that he did not take him meds that AM. Also reported home readings 140/80. He was asked to bring cuff to HTN clinic visit. BP at PCP was 158/108 on 9/18.  At last visit on 03/28/23 amlodipine 2.5mg  daily was started due to home BP in the 140's/90's. Home cuff found to be relatively accurate.   Patient presents today to hypertension clinic.  He brings in his blood pressure cuff from home.  It is an Omron, he does state that it is old.  He denies any dizziness except for if he stands up too quick.  Denies headaches blurred vision or swelling.  He is using his right arm to check his blood pressure at home.  He is pretty active, walks his dog for about a mile each morning.  He has 4 acres of land and a chicken coop which she walks very frequently.  He also carries 50 pound bags of chicken feed.  Home blood pressure cuff found to be accurate.  Clinic cuff: 152/96 Clinic cuff: 140/90  Family History: The patient's family history includes Alzheimer's disease in his sister; Kidney disease in his brother; Stroke in his father and mother.   Social History: no ETOH, no tobacco  Diet: cut out coke and sweet tea after last apt Hot tea- 1 cup a day Breakfast: none Lunch: stir fry chicken and veggies, egg sandwich  Dinner: stir fry chicken and veggies  Rarely eats out  Exercise: walks 1 mile daily with dog and has [employer] farm  Home BP readings:  Mostly  in 115-120/70-75  Wt Readings from Last 3 Encounters:  03/15/23 206 lb 6.4 oz (93.6 kg)  03/14/23 205 lb 12.8 oz (93.4 kg)  09/07/22 205 lb 11.2 oz (93.3 kg)   BP Readings from Last 3 Encounters:  03/28/23 (!) 172/100  03/15/23 (!) 162/99  03/14/23 (!) 170/110   Pulse Readings from Last 3 Encounters:  03/28/23 65  03/15/23 66  03/14/23 63    Renal function: Estimated Creatinine Clearance: 70.4 mL/min (by C-G formula based on SCr of 1.14 mg/dL).  Past Medical History:  Diagnosis Date   Coronary artery disease    Hyperlipidemia    Hypertension    Myocardial infarction (HCC) 06/19/2019    Current Outpatient Medications on File Prior to Visit  Medication Sig Dispense Refill   aspirin 81 MG EC tablet Take 81 mg by mouth daily.      atorvastatin (LIPITOR) 80 MG tablet TAKE 1 TABLET BY MOUTH ONCE DAILY AT  6 PM 90 tablet 0   clopidogrel (PLAVIX) 75 MG tablet Take 1 tablet by mouth once daily 90 tablet 0   metoprolol succinate (TOPROL-XL) 100 MG 24  hr tablet TAKE 1 TABLET BY MOUTH ONCE DAILY WITH OR IMMEDIATELY FOLLOWING A MEAL. 90 tablet 3   olmesartan-hydrochlorothiazide (BENICAR HCT) 40-25 MG tablet Take 1 tablet by mouth once daily 90 tablet 0   potassium chloride SA (KLOR-CON M) 20 MEQ tablet Take 1 tablet (20 mEq total) by mouth 2 (two) times daily. (Patient taking differently: Take 20 mEq by mouth 3 (three) times daily.) 60 tablet 3   triamcinolone cream (KENALOG) 0.1 % Apply 1 application topically daily as needed. 453 g 3   azelastine (ASTELIN) 0.1 % nasal spray Place 2 sprays into both nostrils 2 (two) times daily. 30 mL 12   Current Facility-Administered Medications on File Prior to Visit  Medication Dose Route Frequency Provider Last Rate Last Admin   triamcinolone acetonide (KENALOG-40) injection 40 mg  40 mg Intramuscular Once Janalyn Harder, MD        No Known Allergies   Assessment/Plan:  1. Hypertension -   Assessment: Blood pressure significantly  elevated in clinic above goal less than 130/80 Home blood pressures are quite variable.  Occasional reading at goal but mainly 140s over 90s or even as high as 150s over 100s He has cut out Coke and sweet tea since his appointment with Dr. Tenny Craw We reviewed his diet and sodium recommendations.  I have asked him to log his sodium intake for a day or 2 to get the sense if he is exceeding the 2000 mg recommendation.   Plan: Start amlodipine 2.5 mg daily Continue metoprolol 100 mg daily and olmesartan/hydrochlorothiazide 40/25 mg daily Follow-up in 2 weeks       Thank you  Olene Floss, Pharm.D, BCACP, BCPS, CPP [facility] Division of  [facility] [address], [place], [place] [address]  Phone: [phone]; Fax: [phone]

## 2023-04-08 DIAGNOSIS — R059 Cough, unspecified: Secondary | ICD-10-CM | POA: Diagnosis not present

## 2023-04-08 DIAGNOSIS — U071 COVID-19: Secondary | ICD-10-CM | POA: Diagnosis not present

## 2023-04-10 ENCOUNTER — Ambulatory Visit: Payer: Medicare Other

## 2023-04-19 DIAGNOSIS — J209 Acute bronchitis, unspecified: Secondary | ICD-10-CM | POA: Diagnosis not present

## 2023-04-22 ENCOUNTER — Other Ambulatory Visit: Payer: Self-pay | Admitting: Physician Assistant

## 2023-04-23 ENCOUNTER — Ambulatory Visit: Payer: Medicare Other | Attending: Cardiovascular Disease | Admitting: Pharmacist

## 2023-04-23 VITALS — BP 136/84 | HR 63

## 2023-04-23 DIAGNOSIS — I10 Essential (primary) hypertension: Secondary | ICD-10-CM | POA: Diagnosis not present

## 2023-04-23 MED ORDER — CLOPIDOGREL BISULFATE 75 MG PO TABS: 75.0000 mg | ORAL_TABLET | Freq: Every day | ORAL | 3 refills | Status: DC

## 2023-04-23 MED ORDER — OLMESARTAN MEDOXOMIL-HCTZ 40-25 MG PO TABS: 1.0000 | ORAL_TABLET | Freq: Every day | ORAL | 3 refills | Status: DC

## 2023-04-23 MED ORDER — ATORVASTATIN CALCIUM 80 MG PO TABS: 80.0000 mg | ORAL_TABLET | Freq: Every day | ORAL | 3 refills | Status: DC

## 2023-04-23 NOTE — Progress Notes (Signed)
Patient ID: KRISTEN CHRISTODOULOU                 DOB: 1955/10/06                      MRN: [id number]      HPI: Walter Perez is a 67 y.o. male referred by Dr. Tenny Craw to HTN clinic. PMH is significant for CAD :Myovjue in Nov 2020 showed anteroapical ischemia Cath showed 3 V CAD; On 06/19/19 he underwent CABG x 4 (SVG to PDA and distal lCx; RIMA to LAD and OM1 as Y graft from LIMA. PDA endarterectomy) Post op he had some Afib. PHM also significant for HTN, HL and smoldering myeloma . He was seen by Dr. Tenny Craw 03/14/23. BO in office was 170/110. Patient reported that he did not take him meds that AM. Also reported home readings 140/80. He was asked to bring cuff to HTN clinic visit. BP at PCP was 158/108 on 9/18. At his last visit, he brought in his home BP cuff and it was confirmed to be accurate based on comparison with the office BP cuff. With his blood pressures being elevated still (150-160/90-100) he was started on amlodipine 2.5 mg daily.  At this visit, patient was followed after starting his amlodipine. He denied experiencing any light headedness, blurry vision, or flushing. He confirms strong adherence with his current regimen and this aligns well with his fill history.   Clinic cuff: 138/84 Clinic cuff: 140/88  Current HTN meds: amlodipine 2.5 mg daily, metoprolol succinate 100mg  daily, olmesartan/hydrochlorothiazide 40/25mg  daily  Previously tried:  BP goal: <130/80   Current HTN meds: metoprolol succinate 100mg  daily, olmesartan/hydrochlorothiazide 40/25mg  daily, amlodipine 2.5 mg daily Previously tried:  BP goal: <130/80  Family History: The patient's family history includes Alzheimer's disease in his sister; Kidney disease in his brother; Stroke in his father and mother.   Social History: no ETOH, no tobacco  Diet: cut out coke and sweet tea after last apt Hot tea- 1 cup a day Breakfast: none Lunch: stir fry chicken and veggies, egg sandwich (fish sandwish, salad) Dinner: stir fry chicken  and veggies (grilled chicken, rice, salad) Rarely has dessert Rarely eats out  Exercise: walks 1 mile daily with dog and has [employer]  Home BP readings:  10/21 AM 117/66 Typically 115-125/65-75  Wt Readings from Last 3 Encounters:  03/15/23 206 lb 6.4 oz (93.6 kg)  03/14/23 205 lb 12.8 oz (93.4 kg)  09/07/22 205 lb 11.2 oz (93.3 kg)   BP Readings from Last 3 Encounters:  04/23/23 136/84  03/28/23 (!) 172/100  03/15/23 (!) 162/99   Pulse Readings from Last 3 Encounters:  04/23/23 63  03/28/23 65  03/15/23 66    Renal function: CrCl cannot be calculated (Patient's most recent lab result is older than the maximum 21 days allowed.).  Past Medical History:  Diagnosis Date   Coronary artery disease    Hyperlipidemia    Hypertension    Myocardial infarction (HCC) 06/19/2019    Current Outpatient Medications on File Prior to Visit  Medication Sig Dispense Refill   amLODipine (NORVASC) 2.5 MG tablet Take 1 tablet (2.5 mg total) by mouth daily. 90 tablet 3   aspirin 81 MG EC tablet Take 81 mg by mouth daily.      azelastine (ASTELIN) 0.1 % nasal spray Place 2 sprays into both nostrils 2 (two) times daily. 30 mL 12   metoprolol succinate (TOPROL-XL) 100 MG 24  hr tablet TAKE 1 TABLET BY MOUTH ONCE DAILY WITH OR IMMEDIATELY FOLLOWING A MEAL. 90 tablet 3   nitroGLYCERIN (NITROSTAT) 0.4 MG SL tablet Place 1 tablet (0.4 mg total) under the tongue every 5 (five) minutes as needed for chest pain. 25 tablet 3   potassium chloride SA (KLOR-CON M) 20 MEQ tablet Take 1 tablet (20 mEq total) by mouth 2 (two) times daily. (Patient taking differently: Take 20 mEq by mouth 3 (three) times daily.) 60 tablet 3   triamcinolone cream (KENALOG) 0.1 % Apply 1 application topically daily as needed. 453 g 3   Current Facility-Administered Medications on File Prior to Visit  Medication Dose Route Frequency Provider Last Rate Last Admin   triamcinolone acetonide (KENALOG-40) injection 40 mg   40 mg Intramuscular Once Janalyn Harder, MD         Assessment/Plan:  1. Hypertension -  Assessment: Blood pressure at home well controlled and below goal of <130/80 Blood pressures in the office were a little elevated at 138/84 and 140/88 but this is likely due to 'white-coat' HTN which is consistent with previous visits Patient has made appropriate adjustments to his diet that were discussed (cutting out Coke and sweet tea)  Plan: Continue amlodipine 2.5 mg daily, metoprolol 100 mg daily and olmesartan/hydrochlorothiazide 40/25 mg daily Sent refills for all BP medications and clopidogrel Having patient continue to periodically check BP at home and call the clinic if there are any major changes Follow-up as needed   Thank you  Wilmer Floor, PharmD PGY2 Cardiology Pharmacy Resident   Olene Floss, Pharm.D, BCACP, BCPS, CPP [facility] of [facility] [address], [place], [place] [address]  Phone: [phone]; Fax: [phone]

## 2023-04-23 NOTE — Progress Notes (Deleted)
Patient ID: AWS MALLARE                 DOB: [birth year]                      MRN: [id number]      HPI: Walter Perez is a 67 y.o. male referred by Dr. Tenny Craw to HTN clinic. PMH is significant for CAD :Myovjue in Nov 2020 showed anteroapical ischemia Cath showed 3 V CAD; On 06/19/19 he underwent CABG x 4 (SVG to PDA and distal lCx; RIMA to LAD and OM1 as Y graft from LIMA. PDA endarterectomy) Post op he had some Afib. PHM also significant for HTN, HL and smoldering myeloma . He was seen by Dr. Tenny Craw 03/14/23. BO in office was 170/110. Patient reported that he did not take him meds that AM. Also reported home readings 140/80. He was asked to bring cuff to HTN clinic visit. BP at PCP was 158/108 on 9/18.  At last visit on 03/28/23 amlodipine 2.5mg  daily was started due to home BP in the 140's/90's. Home cuff found to be relatively accurate.   Dizziness, lightheadedness, headache, blurred vision, SOB, swelling Increase amlodipine?   Patient presents today to hypertension clinic.  He brings in his blood pressure cuff from home.  It is an Omron, he does state that it is old.  He denies any dizziness except for if he stands up too quick.  Denies headaches blurred vision or swelling.  He is using his right arm to check his blood pressure at home.  He is pretty active, walks his dog for about a mile each morning.  He has 4 acres of land and a chicken coop which she walks very frequently.  He also carries 50 pound bags of chicken feed.  Home blood pressure cuff found to be accurate.  Home BP cuff: 158/103 Clinic cuff: 172/100 Clinic cuff: 158/104 Home cuff: 165/97  Current HTN meds: metoprolol succinate 100mg  daily, olmesartan/hydrochlorothiazide 40/25mg  daily, amlodipine 2.5 mg daily Previously tried:  BP goal: <130/80  Family History: The patient's family history includes Alzheimer's disease in his sister; Kidney disease in his brother; Stroke in his father and mother.   Social History: no ETOH, no  tobacco  Diet: cut out coke and sweet tea after last apt Hot tea- 1 cup a day Breakfast: none Lunch: stir fry chicken and veggies, egg sandwich  Dinner: stir fry chicken and veggies  Rarely eats out  Exercise: walks 1 mile daily with dog and has [employer]   Home BP readings:  140's/90's mostly A few at goal Some 150's/100  Wt Readings from Last 3 Encounters:  03/15/23 206 lb 6.4 oz (93.6 kg)  03/14/23 205 lb 12.8 oz (93.4 kg)  09/07/22 205 lb 11.2 oz (93.3 kg)   BP Readings from Last 3 Encounters:  03/28/23 (!) 172/100  03/15/23 (!) 162/99  03/14/23 (!) 170/110   Pulse Readings from Last 3 Encounters:  03/28/23 65  03/15/23 66  03/14/23 63    Renal function: CrCl cannot be calculated (Patient's most recent lab result is older than the maximum 21 days allowed.).  Past Medical History:  Diagnosis Date   Coronary artery disease    Hyperlipidemia    Hypertension    Myocardial infarction (HCC) 06/19/2019    Current Outpatient Medications on File Prior to Visit  Medication Sig Dispense Refill   amLODipine (NORVASC) 2.5 MG tablet Take 1 tablet (2.5 mg total) by  mouth daily. 90 tablet 3   aspirin 81 MG EC tablet Take 81 mg by mouth daily.      atorvastatin (LIPITOR) 80 MG tablet TAKE 1 TABLET BY MOUTH ONCE DAILY AT  6 PM 90 tablet 0   azelastine (ASTELIN) 0.1 % nasal spray Place 2 sprays into both nostrils 2 (two) times daily. 30 mL 12   clopidogrel (PLAVIX) 75 MG tablet Take 1 tablet by mouth once daily 90 tablet 0   metoprolol succinate (TOPROL-XL) 100 MG 24 hr tablet TAKE 1 TABLET BY MOUTH ONCE DAILY WITH OR IMMEDIATELY FOLLOWING A MEAL. 90 tablet 3   nitroGLYCERIN (NITROSTAT) 0.4 MG SL tablet Place 1 tablet (0.4 mg total) under the tongue every 5 (five) minutes as needed for chest pain. 25 tablet 3   olmesartan-hydrochlorothiazide (BENICAR HCT) 40-25 MG tablet Take 1 tablet by mouth once daily 90 tablet 0   potassium chloride SA (KLOR-CON M) 20 MEQ tablet  Take 1 tablet (20 mEq total) by mouth 2 (two) times daily. (Patient taking differently: Take 20 mEq by mouth 3 (three) times daily.) 60 tablet 3   triamcinolone cream (KENALOG) 0.1 % Apply 1 application topically daily as needed. 453 g 3   Current Facility-Administered Medications on File Prior to Visit  Medication Dose Route Frequency Provider Last Rate Last Admin   triamcinolone acetonide (KENALOG-40) injection 40 mg  40 mg Intramuscular Once Janalyn Harder, MD        No Known Allergies  There were no vitals taken for this visit.   Assessment/Plan: No BP recorded.  {Refresh Note OR Click here to enter BP  :1}***   1. Hypertension -  No problem-specific Assessment & Plan notes found for this encounter.    Thank you  Olene Floss, Pharm.D, BCACP, BCPS, CPP [facility] Division of [facility] [address], [place], [place] [address]  Phone: [phone]; Fax: [phone]

## 2023-04-23 NOTE — Patient Instructions (Addendum)
Continue taking metoprolol succinate 100mg  daily, olmesartan/hydrochlorothiazide 40/25mg  daily, amlodipine 2.5 mg daily  Please call us if your blood pressure is >130/80 consistently

## 2023-06-29 ENCOUNTER — Other Ambulatory Visit: Payer: Self-pay | Admitting: Internal Medicine

## 2023-09-06 ENCOUNTER — Ambulatory Visit (HOSPITAL_COMMUNITY)
Admission: RE | Admit: 2023-09-06 | Discharge: 2023-09-06 | Disposition: A | Discharge status: Home / Self Care | Source: Ambulatory Visit | Attending: Hematology | Admitting: Hematology

## 2023-09-06 ENCOUNTER — Inpatient Hospital Stay: Payer: Medicare Other | Attending: Hematology

## 2023-09-06 DIAGNOSIS — D472 Monoclonal gammopathy: Secondary | ICD-10-CM

## 2023-09-06 DIAGNOSIS — I2510 Atherosclerotic heart disease of native coronary artery without angina pectoris: Secondary | ICD-10-CM | POA: Diagnosis not present

## 2023-09-06 DIAGNOSIS — Z951 Presence of aortocoronary bypass graft: Secondary | ICD-10-CM | POA: Diagnosis not present

## 2023-09-06 LAB — CBC WITH DIFFERENTIAL/PLATELET
Abs Immature Granulocytes: 0.04 10*3/uL (ref 0.00–0.07)
Basophils Absolute: 0 10*3/uL (ref 0.0–0.1)
Basophils Relative: 1 %
Eosinophils Absolute: 0.3 10*3/uL (ref 0.0–0.5)
Eosinophils Relative: 5 %
HCT: 45.7 % (ref 39.0–52.0)
Hemoglobin: 14.9 g/dL (ref 13.0–17.0)
Immature Granulocytes: 1 %
Lymphocytes Relative: 23 %
Lymphs Abs: 1.5 10*3/uL (ref 0.7–4.0)
MCH: 28.7 pg (ref 26.0–34.0)
MCHC: 32.6 g/dL (ref 30.0–36.0)
MCV: 87.9 fL (ref 80.0–100.0)
Monocytes Absolute: 0.5 10*3/uL (ref 0.1–1.0)
Monocytes Relative: 8 %
Neutro Abs: 4.1 10*3/uL (ref 1.7–7.7)
Neutrophils Relative %: 62 %
Platelets: 186 10*3/uL (ref 150–400)
RBC: 5.2 MIL/uL (ref 4.22–5.81)
RDW: 12.8 % (ref 11.5–15.5)
WBC: 6.6 10*3/uL (ref 4.0–10.5)
nRBC: 0 % (ref 0.0–0.2)

## 2023-09-06 LAB — COMPREHENSIVE METABOLIC PANEL
ALT: 30 U/L (ref 0–44)
AST: 24 U/L (ref 15–41)
Albumin: 4.2 g/dL (ref 3.5–5.0)
Alkaline Phosphatase: 63 U/L (ref 38–126)
Anion gap: 9 (ref 5–15)
BUN: 18 mg/dL (ref 8–23)
CO2: 29 mmol/L (ref 22–32)
Calcium: 9 mg/dL (ref 8.9–10.3)
Chloride: 100 mmol/L (ref 98–111)
Creatinine, Ser: 0.97 mg/dL (ref 0.61–1.24)
GFR, Estimated: >60 mL/min (ref >60)
Glucose, Bld: 125 mg/dL — ABNORMAL HIGH (ref 70–99)
Potassium: 3.8 mmol/L (ref 3.5–5.1)
Sodium: 138 mmol/L (ref 135–145)
Total Bilirubin: 1 mg/dL (ref 0.0–1.2)
Total Protein: 8.4 g/dL — ABNORMAL HIGH (ref 6.5–8.1)

## 2023-09-06 LAB — LACTATE DEHYDROGENASE: LDH: 165 U/L (ref 98–192)

## 2023-09-07 LAB — BETA 2 MICROGLOBULIN, SERUM: Beta-2 Microglobulin: 1.7 mg/L (ref 0.6–2.4)

## 2023-09-07 LAB — KAPPA/LAMBDA LIGHT CHAINS
Kappa free light chain: 10.1 mg/L (ref 3.3–19.4)
Kappa, lambda light chain ratio: 0.05 — ABNORMAL LOW (ref 0.26–1.65)
Lambda free light chains: 197.8 mg/L — ABNORMAL HIGH (ref 5.7–26.3)

## 2023-09-09 NOTE — Progress Notes (Unsigned)
 Cardiology Office Note   Date:  09/09/2023   ID:  Walter Perez, DOB 07-23-1955, MRN [id number]  PCP:  Bary Leriche, PA-C  Cardiologist:   Dietrich Pates, MD   Pt presents for f/u of CAD      History of Present Illness: Walter Perez is a 68 y.o. male with a history of CAD :Myovjue in Nov 2020 showed anteroapical ischemia   Cath showed 3 V CAD; On 06/19/19 he underwent CABG x 4 (SVG to  PDA and distal lCx; RIMA to LAD and OM1 as Y graft from LIMA.  PDA endarterectomy)  Post op he had some Afib  Finished 30 days of AMiodarone     The pt also hsa a hx of HL, HTN, MBGUS   I saw the pt in April 2023   Pt denies CP   rare SOB No palpitations   No edema No dizziness   walk 1 mile per day   Walks also on chicken farm     BP at home 140/80   had cuff checked   He says he did not take meds this am   I last saw the pt in clinic in Sept 2024  No outpatient medications have been marked as taking for the 09/10/23 encounter (Appointment) with Pricilla Riffle, MD.   Current Facility-Administered Medications for the 09/10/23 encounter (Appointment) with Pricilla Riffle, MD  Medication   triamcinolone acetonide (KENALOG-40) injection 40 mg     Allergies:   Patient has no known allergies.   Past Medical History:  Diagnosis Date   Coronary artery disease    Hyperlipidemia    Hypertension    Myocardial infarction (HCC) 06/19/2019    Past Surgical History:  Procedure Laterality Date   CORONARY ARTERY BYPASS GRAFT  06/19/2019   quadruple     Social History:  The patient  reports that he has never smoked. He has never used smokeless tobacco. He reports that he does not drink alcohol and does not use drugs.   Family History:  The patient's family history includes Alzheimer's disease in his sister; Kidney disease in his brother; Stroke in his father and mother.    ROS:  Please see the history of present illness. All other systems are reviewed and  Negative to the above problem except as noted.     PHYSICAL EXAM: VS:  There were no vitals taken for this visit.  BP was 170/100 on my check   GEN: OBese 68 yo n no acute distress  HEENT: normal  Neck: no JVD, carotid bruit Cardiac: RRR; no murmur  NO  LE  edema  Respiratory:  clear to auscultation GI: soft, nontender, No hepatomegaly  MS: no deformity Moving all extremities   ct   EKG:  EKG is ordered today.  NSR 63 bpm     Lipid Panel    Component Value Date/Time   CHOL 131 08/06/2020 1607   TRIG 140 08/06/2020 1607   HDL 40 08/06/2020 1607   CHOLHDL 3.3 08/06/2020 1607   LDLCALC 66 08/06/2020 1607      Wt Readings from Last 3 Encounters:  03/15/23 206 lb 6.4 oz (93.6 kg)  03/14/23 205 lb 12.8 oz (93.4 kg)  09/07/22 205 lb 11.2 oz (93.3 kg)      ASSESSMENT AND PLAN:   1  CAD Pt s/p CABG ins 2020 at [facility]  Remainssymptom free   2 HL   Will check lipomed today    3  HTN   Pt says his BP is 140/ at home   Did not take meds today    Will have him come in with cuff and BP readings in 2wks    to pharmacy      4  Diet  will check A1C    Discussed sugars, carbs        F/U next s[romg     Current medicines are reviewed at length with the patient today.  The patient does not have concerns regarding medicines.  Signed, Dietrich Pates, MD  09/09/2023 2:43 PM    [facility] [address] [place], [place], [place]  [address] Phone: [phone]; Fax: [phone]

## 2023-09-10 ENCOUNTER — Ambulatory Visit: Payer: Medicare Other | Attending: Internal Medicine | Admitting: Internal Medicine

## 2023-09-10 ENCOUNTER — Encounter: Payer: Self-pay | Admitting: Internal Medicine

## 2023-09-10 VITALS — BP 140/88 | HR 71 | Ht 67.0 in | Wt 215.6 lb

## 2023-09-10 DIAGNOSIS — I2510 Atherosclerotic heart disease of native coronary artery without angina pectoris: Secondary | ICD-10-CM

## 2023-09-10 LAB — PROTEIN ELECTROPHORESIS, SERUM
A/G Ratio: 1.1 (ref 0.7–1.7)
Albumin ELP: 4 g/dL (ref 2.9–4.4)
Alpha-1-Globulin: 0.2 g/dL (ref 0.0–0.4)
Alpha-2-Globulin: 0.7 g/dL (ref 0.4–1.0)
Beta Globulin: 0.9 g/dL (ref 0.7–1.3)
Gamma Globulin: 2 g/dL — ABNORMAL HIGH (ref 0.4–1.8)
Globulin, Total: 3.8 g/dL (ref 2.2–3.9)
M-Spike, %: 1.7 g/dL — ABNORMAL HIGH
Total Protein ELP: 7.8 g/dL (ref 6.0–8.5)

## 2023-09-10 MED ORDER — AMLODIPINE BESYLATE 5 MG PO TABS: 5.0000 mg | ORAL_TABLET | Freq: Every day | ORAL | 3 refills | Status: AC

## 2023-09-10 NOTE — Patient Instructions (Signed)
 Medication Instructions:  INCREASE AMLODIPINE TO 5 MG A DAY  *If you need a refill on your cardiac medications before your next appointment, please call your pharmacy*   Lab Work:  If you have labs (blood work) drawn today and your tests are completely normal, you will receive your results only by: MyChart Message (if you have MyChart) OR A paper copy in the mail If you have any lab test that is abnormal or we need to change your treatment, we will call you to review the results.   Testing/Procedures:    Follow-Up: At [facility], you and your health needs are our priority.  As part of our continuing mission to provide you with exceptional heart care, we have created designated Provider Care Teams.  These Care Teams include your primary Cardiologist (physician) and Advanced Practice Providers (APPs -  Physician Assistants and Nurse Practitioners) who all work together to provide you with the care you need, when you need it.  We recommend signing up for the patient portal called "MyChart".  Sign up information is provided on this After Visit Summary.  MyChart is used to connect with patients for Virtual Visits (Telemedicine).  Patients are able to view lab/test results, encounter notes, upcoming appointments, etc.  Non-urgent messages can be sent to your provider as well.   To learn more about what you can do with MyChart, go to [email or web address].    Your next appointment:  NOV/ DEC 2025 WITH DR Tenny Craw

## 2023-09-12 NOTE — Progress Notes (Signed)
 [facility] [address], [place] [address]    Clinic Day:  09/13/2023  Referring physician: Allwardt, Crist Infante, PA-C  Patient Care Team: Allwardt, Walter Perez as PCP - General (Physician Assistant) Walter Riffle, MD as PCP - Cardiology (Cardiology) Walter Harder, MD (Inactive) as Consulting Physician (Dermatology)   ASSESSMENT & PLAN:   Assessment: 1.  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  Lambda light chains 303, ratio of 0.03.  LDH normal.  Beta-2 microglobulin 1.7.  Serum viscosity normal.  Hemoglobin, calcium and creatinine normal. -Bone marrow biopsy on 04/30/2019 showed atypical plasma cells representing 12% in the aspirate.  Plasma cells display lambda light chain restriction. -Myeloma FISH panel shows gain of 1 q.  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 -24-hour urine negative for immunofixation and UPEP.  Urine total protein was undetectable. -Skeletal survey on 05/09/2019 did not show any evidence of lytic lesions.  Insurance refused PET CT scan. -MRI of the thoracic, lumbar spine and pelvis with and without contrast on 06/18/2019 did not show any evidence of myeloma. -Based on Mayo 2018 risk stratification system, his M spike is more than 2 g/dL.  Involved/uninvolved free light chain ratio was more than 20.  Bone marrow plasma cells were less than 20%.  With 2 risk factors, he was considered high risk with estimated median time to progression of 29 months, estimated risk of progression of 24 %/year during first 2 years, 11 %/year for the next 3 years, 3 %/year for the next 5 years.   2.  CAD: -Status post CABG at [facility] in December 2020.  Plan:  1.  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  Denies any infections in the past 6 months. - Reviewed labs from 09/06/2023: Creatinine and calcium are normal.  M spike is 1.7 g and stable.  CBC was  normal.  Lambda light chains were 197 and ratio is 0.05 and stable. - Skeletal survey on 09/06/2023: No lytic lesions. - No "crab" features to warrant further workup and treatment.  Will continue monitoring closely.  Return back to clinic in 6 months with repeat labs.  Will repeat skeletal survey in 1 year.    Orders Placed This Encounter  Procedures   CBC with Differential    Standing Status:   Future    Expected Date:   03/10/2024    Expiration Date:   09/12/2024   Comprehensive metabolic panel    Standing Status:   Future    Expected Date:   03/10/2024    Expiration Date:   09/12/2024   Kappa/lambda light chains    Standing Status:   Future    Expected Date:   03/10/2024    Expiration Date:   09/12/2024   Protein electrophoresis, serum    Standing Status:   Future    Expected Date:   03/10/2024    Expiration Date:   09/12/2024      Walter Perez,acting as a scribe for Walter Massed, MD.,have documented all relevant documentation on the behalf of Walter Massed, MD,as directed by  Walter Massed, MD while in the presence of Walter Massed, MD.  I, Walter Massed MD, have reviewed the above documentation for accuracy and completeness, and I agree with the above.     Walter Massed, MD   3/13/20258:30 AM  CHIEF COMPLAINT:   Diagnosis: smoldering myeloma  Cancer Staging  No matching staging information was found for the patient.    Prior Therapy: none  Current Therapy:  surveillance   HISTORY OF PRESENT ILLNESS:   Oncology History   No history exists.     INTERVAL HISTORY:   Walter Perez is a 68 y.o. male presenting to clinic today for follow up of smoldering myeloma. He was last seen by me on 03/15/23.  Since his last visit, he underwent DG Bone Survey Met on 09/06/23 that found: No suspicious lytic or bony destructive lesion.   Today, he states that he is doing well overall. His appetite level is at 100%. His energy level is at 100%. He denies  any new onset pains, fevers, infections, or night sweats in the last 6 months. Walter Perez notes occasional tingling and numbness in the bilateral fingertips. He recently saw cardiology a few days ago and denies any heart issues.   PAST MEDICAL HISTORY:   Past Medical History: Past Medical History:  Diagnosis Date   Coronary artery disease    Hyperlipidemia    Hypertension    Myocardial infarction (HCC) 06/19/2019    Surgical History: Past Surgical History:  Procedure Laterality Date   CORONARY ARTERY BYPASS GRAFT  06/19/2019   quadruple    Social History: Social History   Socioeconomic History   Marital status: Married    Spouse name: Not on file   Number of children: 2   Years of education: Not on file   Highest education level: Not on file  Occupational History   Occupation: Unemployed  Tobacco Use   Smoking status: Never   Smokeless tobacco: Never  Substance and Sexual Activity   Alcohol use: Never   Drug use: Never   Sexual activity: Yes  Other Topics Concern   Not on file  Social History Narrative   Not on file   Social Drivers of Health   Financial Resource Strain: Not on file  Food Insecurity: Low Risk  (04/19/2023)   Received from [facility]   Hunger Vital Sign    Worried About Running Out of Food in the Last Year: Never true    Ran Out of Food in the Last Year: Never true  Transportation Needs: No Transportation Needs (04/19/2023)   Received from [facility]    In the past 12 months, has lack of reliable transportation kept you from medical appointments, meetings, work or from getting things needed for daily living? : No  Physical Activity: Not on file  Stress: Not on file  Social Connections: Not on file  Intimate Partner Violence: Unknown (10/03/2021)   Received from [facility], [facility]   HITS    Physically Hurt: Not on file    Insult or Talk Down To: Not on file    Threaten Physical Harm: Not on file    Scream or Curse:  Not on file    Family History: Family History  Problem Relation Age of Onset   Stroke Mother    Stroke Father    Kidney disease Brother    Alzheimer's disease Sister     Current Medications:  Current Outpatient Medications:    amLODipine (NORVASC) 5 MG tablet, Take 1 tablet (5 mg total) by mouth daily., Disp: 90 tablet, Rfl: 3   aspirin 81 MG EC tablet, Take 81 mg by mouth daily. , Disp: , Rfl:    atorvastatin (LIPITOR) 80 MG tablet, Take 1 tablet (80 mg total) by mouth daily., Disp: 90 tablet, Rfl:  3   azelastine (ASTELIN) 0.1 % nasal spray, Place 2 sprays into both nostrils 2 (two) times daily., Disp: 30 mL, Rfl: 12   clopidogrel (PLAVIX) 75 MG tablet, Take 1 tablet (75 mg total) by mouth daily., Disp: 90 tablet, Rfl: 3   metoprolol succinate (TOPROL-XL) 100 MG 24 hr tablet, TAKE 1 TABLET BY MOUTH ONCE DAILY WITH  OR  IMMEDIATELY  FOLLOWING  A  MEAL., Disp: 90 tablet, Rfl: 3   nitroGLYCERIN (NITROSTAT) 0.4 MG SL tablet, Place 1 tablet (0.4 mg total) under the tongue every 5 (five) minutes as needed for chest pain., Disp: 25 tablet, Rfl: 3   olmesartan-hydrochlorothiazide (BENICAR HCT) 40-25 MG tablet, Take 1 tablet by mouth daily., Disp: 90 tablet, Rfl: 3   potassium chloride SA (KLOR-CON M) 20 MEQ tablet, Take 1 tablet (20 mEq total) by mouth 2 (two) times daily. (Patient taking differently: Take 20 mEq by mouth 3 (three) times daily.), Disp: 60 tablet, Rfl: 3   triamcinolone cream (KENALOG) 0.1 %, Apply 1 application topically daily as needed., Disp: 453 g, Rfl: 3  Current Facility-Administered Medications:    triamcinolone acetonide (KENALOG-40) injection 40 mg, 40 mg, Intramuscular, Once, Walter Harder, MD   Allergies: No Known Allergies  REVIEW OF SYSTEMS:   Review of Systems  Constitutional:  Negative for chills, fatigue and fever.  HENT:   Negative for lump/mass, mouth sores, nosebleeds, sore throat and trouble swallowing.   Eyes:  Negative for eye problems.   Respiratory:  Negative for cough and shortness of breath.   Cardiovascular:  Negative for chest pain, leg swelling and palpitations.  Gastrointestinal:  Negative for abdominal pain, constipation, diarrhea, nausea and vomiting.  Genitourinary:  Negative for bladder incontinence, difficulty urinating, dysuria, frequency, hematuria and nocturia.   Musculoskeletal:  Negative for arthralgias, back pain, flank pain, myalgias and neck pain.  Skin:  Negative for itching and rash.  Neurological:  Positive for numbness (and tingling in the fingertips). Negative for dizziness and headaches.  Hematological:  Does not bruise/bleed easily.  Psychiatric/Behavioral:  Negative for depression, sleep disturbance and suicidal ideas. The patient is not nervous/anxious.   All other systems reviewed and are negative.    VITALS:   Blood pressure (!) 159/98, pulse 65, temperature 98.3 F (36.8 C), temperature source Oral, resp. rate 16, weight 214 lb 11.7 oz (97.4 kg), SpO2 97%.  Wt Readings from Last 3 Encounters:  09/13/23 214 lb 11.7 oz (97.4 kg)  09/10/23 215 lb 9.6 oz (97.8 kg)  03/15/23 206 lb 6.4 oz (93.6 kg)    Body mass index is 33.63 kg/m.  Performance status (ECOG): 1 - Symptomatic but completely ambulatory  PHYSICAL EXAM:   Physical Exam Vitals and nursing note reviewed. Exam conducted with a chaperone present.  Constitutional:      Appearance: Normal appearance.  Cardiovascular:     Rate and Rhythm: Normal rate and regular rhythm.     Pulses: Normal pulses.     Heart sounds: Normal heart sounds.  Pulmonary:     Effort: Pulmonary effort is normal.     Breath sounds: Normal breath sounds.  Abdominal:     Palpations: Abdomen is soft. There is no hepatomegaly, splenomegaly or mass.     Tenderness: There is no abdominal tenderness.  Musculoskeletal:     Right lower leg: No edema.     Left lower leg: No edema.  Lymphadenopathy:     Cervical: No cervical adenopathy.     Right cervical:  No superficial, deep  or posterior cervical adenopathy.    Left cervical: No superficial, deep or posterior cervical adenopathy.     Upper Body:     Right upper body: No supraclavicular or axillary adenopathy.     Left upper body: No supraclavicular or axillary adenopathy.  Neurological:     General: No focal deficit present.     Mental Status: He is alert and oriented to person, place, and time.  Psychiatric:        Mood and Affect: Mood normal.        Behavior: Behavior normal.     LABS:      Latest Ref Rng & Units 09/06/2023    8:02 AM 03/14/2023   10:29 AM 03/07/2023    8:18 AM  CBC  WBC 4.0 - 10.5 K/uL 6.6  6.3  6.1   Hemoglobin 13.0 - 17.0 g/dL 29.5  62.1  30.8   Hematocrit 39.0 - 52.0 % 45.7  44.4  46.1   Platelets 150 - 400 K/uL 186  171  176       Latest Ref Rng & Units 09/06/2023    8:02 AM 03/14/2023   10:29 AM 03/07/2023    8:18 AM  CMP  Glucose 70 - 99 mg/dL 657  846  962   BUN 8 - 23 mg/dL 18  13  18    Creatinine 0.61 - 1.24 mg/dL 9.52  8.41  3.24   Sodium 135 - 145 mmol/L 138  136  138   Potassium 3.5 - 5.1 mmol/L 3.8  3.6  3.8   Chloride 98 - 111 mmol/L 100  98  98   CO2 22 - 32 mmol/L 29  26  28    Calcium 8.9 - 10.3 mg/dL 9.0  9.3  9.4   Total Protein 6.5 - 8.1 g/dL 8.4   8.7   Total Bilirubin 0.0 - 1.2 mg/dL 1.0   1.4   Alkaline Phos 38 - 126 U/L 63   66   AST 15 - 41 U/L 24   27   ALT 0 - 44 U/L 30   30      No results found for: "CEA1", "CEA" / No results found for: "CEA1", "CEA" No results found for: "PSA1" No results found for: "CAN199" No results found for: "CAN125"  Lab Results  Component Value Date   TOTALPROTELP 7.8 09/06/2023   ALBUMINELP 4.0 09/06/2023   A1GS 0.2 09/06/2023   A2GS 0.7 09/06/2023   BETS 0.9 09/06/2023   GAMS 2.0 (H) 09/06/2023   MSPIKE 1.7 (H) 09/06/2023   SPEI Comment 09/06/2023   No results found for: "TIBC", "FERRITIN", "IRONPCTSAT" Lab Results  Component Value Date   LDH 165 09/06/2023   LDH 168 08/31/2022   LDH  165 02/21/2022     STUDIES:   DG Bone Survey Met Result Date: 09/12/2023 CLINICAL DATA:  Smoldering myeloma. EXAM: METASTATIC BONE SURVEY COMPARISON:  Bone survey 08/31/2022 FINDINGS: Stable small lucency in the right femoral neck with peripherally sclerotic margins, unchanged over multiple prior exams. No suspicious lytic or bony destructive lesion. Degenerative change throughout the spine without compression deformity. Prior median sternotomy. Aortic atherosclerosis. IMPRESSION: No suspicious lytic or bony destructive lesion. Electronically Signed   By: Narda Rutherford M.D.   On: 09/12/2023 10:19

## 2023-09-13 ENCOUNTER — Inpatient Hospital Stay: Payer: Medicare Other | Admitting: Hematology

## 2023-09-13 VITALS — BP 159/98 | HR 65 | Temp 98.3°F | Resp 16 | Wt 214.7 lb

## 2023-09-13 DIAGNOSIS — D472 Monoclonal gammopathy: Secondary | ICD-10-CM | POA: Diagnosis not present

## 2023-09-13 NOTE — Patient Instructions (Addendum)

## 2024-02-21 ENCOUNTER — Telehealth: Payer: Self-pay | Admitting: Internal Medicine

## 2024-02-21 NOTE — Telephone Encounter (Signed)
 Reivew of records  Pt should stop Plavix    Keep on aspirin

## 2024-02-21 NOTE — Telephone Encounter (Signed)
 Spoke with pt, aware of dr nada recommendations. Follow up scheduled per recall.

## 2024-02-21 NOTE — Telephone Encounter (Signed)
 Left message for pt to call.

## 2024-02-21 NOTE — Addendum Note (Signed)
 Addended by: Luisantonio Adinolfi W on: 02/21/2024 04:36 PM   Modules accepted: Orders

## 2024-03-10 ENCOUNTER — Inpatient Hospital Stay: Attending: Physician Assistant

## 2024-03-10 DIAGNOSIS — D472 Monoclonal gammopathy: Secondary | ICD-10-CM | POA: Insufficient documentation

## 2024-03-10 LAB — CBC WITH DIFFERENTIAL/PLATELET
Abs Immature Granulocytes: 0.01 K/uL (ref 0.00–0.07)
Basophils Absolute: 0 K/uL (ref 0.0–0.1)
Basophils Relative: 0 %
Eosinophils Absolute: 0.2 K/uL (ref 0.0–0.5)
Eosinophils Relative: 5 %
HCT: 43.9 % (ref 39.0–52.0)
Hemoglobin: 14.5 g/dL (ref 13.0–17.0)
Immature Granulocytes: 0 %
Lymphocytes Relative: 22 %
Lymphs Abs: 1.1 K/uL (ref 0.7–4.0)
MCH: 29.1 pg (ref 26.0–34.0)
MCHC: 33 g/dL (ref 30.0–36.0)
MCV: 88 fL (ref 80.0–100.0)
Monocytes Absolute: 0.4 K/uL (ref 0.1–1.0)
Monocytes Relative: 8 %
Neutro Abs: 3.2 K/uL (ref 1.7–7.7)
Neutrophils Relative %: 65 %
Platelets: 186 K/uL (ref 150–400)
RBC: 4.99 MIL/uL (ref 4.22–5.81)
RDW: 12.7 % (ref 11.5–15.5)
WBC: 4.9 K/uL (ref 4.0–10.5)
nRBC: 0 % (ref 0.0–0.2)

## 2024-03-10 LAB — COMPREHENSIVE METABOLIC PANEL WITH GFR
ALT: 46 U/L — ABNORMAL HIGH (ref 0–44)
AST: 31 U/L (ref 15–41)
Albumin: 4.1 g/dL (ref 3.5–5.0)
Alkaline Phosphatase: 73 U/L (ref 38–126)
Anion gap: 9 (ref 5–15)
BUN: 13 mg/dL (ref 8–23)
CO2: 27 mmol/L (ref 22–32)
Calcium: 8.8 mg/dL — ABNORMAL LOW (ref 8.9–10.3)
Chloride: 99 mmol/L (ref 98–111)
Creatinine, Ser: 0.99 mg/dL (ref 0.61–1.24)
GFR, Estimated: >60 mL/min (ref >60)
Glucose, Bld: 106 mg/dL — ABNORMAL HIGH (ref 70–99)
Potassium: 3.4 mmol/L — ABNORMAL LOW (ref 3.5–5.1)
Sodium: 135 mmol/L (ref 135–145)
Total Bilirubin: 1.6 mg/dL — ABNORMAL HIGH (ref 0.0–1.2)
Total Protein: 8.1 g/dL (ref 6.5–8.1)

## 2024-03-11 LAB — KAPPA/LAMBDA LIGHT CHAINS
Kappa free light chain: 15.3 mg/L (ref 3.3–19.4)
Kappa, lambda light chain ratio: 0.06 — ABNORMAL LOW (ref 0.26–1.65)
Lambda free light chains: 250.9 mg/L — ABNORMAL HIGH (ref 5.7–26.3)

## 2024-03-12 LAB — PROTEIN ELECTROPHORESIS, SERUM
A/G Ratio: 1 (ref 0.7–1.7)
Albumin ELP: 3.9 g/dL (ref 2.9–4.4)
Alpha-1-Globulin: 0.2 g/dL (ref 0.0–0.4)
Alpha-2-Globulin: 0.7 g/dL (ref 0.4–1.0)
Beta Globulin: 0.8 g/dL (ref 0.7–1.3)
Gamma Globulin: 2.1 g/dL — ABNORMAL HIGH (ref 0.4–1.8)
Globulin, Total: 3.9 g/dL (ref 2.2–3.9)
M-Spike, %: 1.9 g/dL — ABNORMAL HIGH
Total Protein ELP: 7.8 g/dL (ref 6.0–8.5)

## 2024-03-16 NOTE — Progress Notes (Addendum)
 [facility] [address], [place] [address]   CLINIC:  Medical Oncology/Hematology  PCP:  Kathrene Mardy HERO, PA-C [address] [place] [address] [phone]   REASON FOR VISIT:  Follow-up for IgG lambda smoldering myeloma, high risk  CURRENT THERAPY: Surveillance  INTERVAL HISTORY:   Walter Perez 69 y.o. male returns for routine follow-up of high risk IgG lambda smoldering myeloma. He was last seen by Dr. Rogers on 09/13/2023.  At today's visit, he reports feeling well.  No recent hospitalizations, surgeries, or changes in baseline health status.  He denies any new bone pain or recent fractures. He denies any B symptoms such as fever, chills, night sweats, unintentional weight loss..   No new neurologic symptoms such as tinnitus, new-onset hearing loss, blurred vision, headache, or dizziness. He has occasional tingling and numbness in bilateral fingertips. No recurrent infections No new masses or lymphadenopathy per his report.  He has 100% energy and 100% appetite. He endorses that he is maintaining a stable weight.  ASSESSMENT & PLAN:  1.  IgG lambda smoldering myeloma, high risk: - Blood work by PMD on 04/21/2019 showed SPEP 2 g.  Lambda light chains 303, ratio of 0.03.  LDH normal.  Beta-2  microglobulin 1.7.  Serum viscosity normal.  Hemoglobin, calcium  and creatinine normal. - Bone marrow biopsy on 04/30/2019 showed atypical plasma cells representing 12% in the aspirate.  Plasma cells display lambda light chain restriction. - Myeloma FISH panel shows gain of 1q.  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 - 24-hour urine negative for immunofixation and UPEP.  Urine total protein was undetectable. - Skeletal survey on 05/09/2019 did not show any evidence of lytic lesions.  Insurance refused PET CT scan. - MRI of the thoracic, lumbar spine and pelvis with and without contrast on 06/18/2019 did not show any evidence of myeloma. - Based on Mayo  2018 Risk Stratification System, his M spike is more than 2 g/dL.  Involved/uninvolved free light chain ratio was more than 20.  Bone marrow plasma cells were less than 20%.  With 2 risk factors, he was considered high risk with estimated median time to progression of 29 months, estimated risk of progression of 24 %/year during first 2 years, 11 %/year for the next 3 years, 3 %/year for the next 5 years. - Most recent MGUS/myeloma panel (03/10/2024): M spike 1.9% (stable) Involved FLC ratio = 16.40 (lambda 250.9/15.3) Hgb 14.5, creatinine 0.99, calcium  8.8 - Most recent skeletal survey (09/06/2023): No lytic lesions.  There is a stable small lucency in right femoral neck with peripherally sclerotic margins, unchanged over multiple prior exams. - He does not report any new onset bone pains.  Denies any infections in the past 6 months. - PLAN: No CRAB features to warrant further workup and treatment.  Continue close monitoring. - RTC with labs in 6 months. - Annual skeletal survey also due in 6 months  2.  Other PMH - S/p CABG at [facility] in December 2020  PLAN SUMMARY: >> SKELETAL SURVEY in 6 months >> Labs in 6 months = CBC/D, CMP, light chains, SPEP, LDH >> OFFICE visit in 6 months (1 week after labs/x-ray)     REVIEW OF SYSTEMS: No complaints   Review of Systems  Constitutional:  Negative for appetite change, chills, diaphoresis, fatigue, fever and unexpected weight change.  HENT:   Negative for lump/mass and nosebleeds.   Eyes:  Negative for eye problems.  Respiratory:  Negative for cough, hemoptysis and  shortness of breath.   Cardiovascular:  Negative for chest pain, leg swelling and palpitations.  Gastrointestinal:  Negative for abdominal pain, blood in stool, constipation, diarrhea, nausea and vomiting.  Genitourinary:  Negative for hematuria.   Skin: Negative.   Neurological:  Negative for dizziness, headaches and light-headedness.  Hematological:  Does not bruise/bleed easily.      PHYSICAL EXAM:  ECOG PERFORMANCE STATUS: 0 - Asymptomatic  Vitals:   03/17/24 0936  BP: 123/75  Pulse: 61  Resp: 16  Temp: 98.2 F (36.8 C)  SpO2: 97%   Filed Weights   03/17/24 0936  Weight: 200 lb 2.8 oz (90.8 kg)   Physical Exam Constitutional:      Appearance: Normal appearance. He is obese.  Cardiovascular:     Heart sounds: Normal heart sounds.  Pulmonary:     Breath sounds: Normal breath sounds.  Neurological:     General: No focal deficit present.     Mental Status: Mental status is at baseline.  Psychiatric:        Behavior: Behavior normal. Behavior is cooperative.     PAST MEDICAL/SURGICAL HISTORY:  Past Medical History:  Diagnosis Date   Coronary artery disease    Hyperlipidemia    Hypertension    Myocardial infarction (HCC) 06/19/2019   Past Surgical History:  Procedure Laterality Date   CORONARY ARTERY BYPASS GRAFT  06/19/2019   quadruple    SOCIAL HISTORY:  Social History   Socioeconomic History   Marital status: Married    Spouse name: Not on file   Number of children: 2   Years of education: Not on file   Highest education level: Not on file  Occupational History   Occupation: Unemployed  Tobacco Use   Smoking status: Never   Smokeless tobacco: Never  Substance and Sexual Activity   Alcohol use: Never   Drug use: Never   Sexual activity: Yes  Other Topics Concern   Not on file  Social History Narrative   Not on file   Social Drivers of Health   Financial Resource Strain: Not on file  Food Insecurity: Low Risk  (03/10/2024)   Received from [facility]   Hunger Vital Sign    Within the past 12 months, you worried that your food would run out before you got money to buy more: Never true    Within the past 12 months, the food you bought just didn't last and you didn't have money to get more. : Never true  Transportation Needs: No Transportation Needs (03/10/2024)   Received from [facility]    In the  past 12 months, has lack of reliable transportation kept you from medical appointments, meetings, work or from getting things needed for daily living? : No  Physical Activity: Not on file  Stress: Not on file  Social Connections: Not on file  Intimate Partner Violence: Unknown (10/03/2021)   Received from [facility]   HITS    Physically Hurt: Not on file    Insult or Talk Down To: Not on file    Threaten Physical Harm: Not on file    Scream or Curse: Not on file    FAMILY HISTORY:  Family History  Problem Relation Age of Onset   Stroke Mother    Stroke Father    Kidney disease Brother    Alzheimer's disease Sister     CURRENT MEDICATIONS:  Outpatient Encounter Medications as of 03/17/2024  Medication Sig   amLODipine  (NORVASC ) 5  MG tablet Take 1 tablet (5 mg total) by mouth daily.   aspirin 81 MG EC tablet Take 81 mg by mouth daily.    atorvastatin  (LIPITOR) 80 MG tablet Take 1 tablet (80 mg total) by mouth daily.   azelastine  (ASTELIN ) 0.1 % nasal spray Place 2 sprays into both nostrils 2 (two) times daily.   metoprolol  succinate (TOPROL -XL) 100 MG 24 hr tablet TAKE 1 TABLET BY MOUTH ONCE DAILY WITH  OR  IMMEDIATELY  FOLLOWING  A  MEAL.   nitroGLYCERIN  (NITROSTAT ) 0.4 MG SL tablet Place 1 tablet (0.4 mg total) under the tongue every 5 (five) minutes as needed for chest pain.   olmesartan -hydrochlorothiazide (BENICAR  HCT) 40-25 MG tablet Take 1 tablet by mouth daily.   potassium chloride  SA (KLOR-CON  M) 20 MEQ tablet Take 1 tablet (20 mEq total) by mouth 2 (two) times daily. (Patient taking differently: Take 20 mEq by mouth 3 (three) times daily.)   triamcinolone  cream (KENALOG ) 0.1 % Apply 1 application topically daily as needed.   Facility-Administered Encounter Medications as of 03/17/2024  Medication   triamcinolone  acetonide (KENALOG -40) injection 40 mg    ALLERGIES:  No Known Allergies  LABORATORY DATA:  I have reviewed the labs as listed.  CBC    Component Value  Date/Time   WBC 4.9 03/10/2024 0950   RBC 4.99 03/10/2024 0950   HGB 14.5 03/10/2024 0950   HGB 15.0 03/14/2023 1029   HCT 43.9 03/10/2024 0950   HCT 44.4 03/14/2023 1029   PLT 186 03/10/2024 0950   PLT 171 03/14/2023 1029   MCV 88.0 03/10/2024 0950   MCV 87 03/14/2023 1029   MCH 29.1 03/10/2024 0950   MCHC 33.0 03/10/2024 0950   RDW 12.7 03/10/2024 0950   RDW 13.2 03/14/2023 1029   LYMPHSABS 1.1 03/10/2024 0950   MONOABS 0.4 03/10/2024 0950   EOSABS 0.2 03/10/2024 0950   BASOSABS 0.0 03/10/2024 0950      Latest Ref Rng & Units 03/10/2024    9:50 AM 09/06/2023    8:02 AM 03/14/2023   10:29 AM  CMP  Glucose 70 - 99 mg/dL 893  874  887   BUN 8 - 23 mg/dL 13  18  13    Creatinine 0.61 - 1.24 mg/dL 9.00  9.02  8.85   Sodium 135 - 145 mmol/L 135  138  136   Potassium 3.5 - 5.1 mmol/L 3.4  3.8  3.6   Chloride 98 - 111 mmol/L 99  100  98   CO2 22 - 32 mmol/L 27  29  26    Calcium  8.9 - 10.3 mg/dL 8.8  9.0  9.3   Total Protein 6.5 - 8.1 g/dL 8.1  8.4    Total Bilirubin 0.0 - 1.2 mg/dL 1.6  1.0    Alkaline Phos 38 - 126 U/L 73  63    AST 15 - 41 U/L 31  24    ALT 0 - 44 U/L 46  30      DIAGNOSTIC IMAGING:  I have independently reviewed the relevant imaging and discussed with the patient.   WRAP UP:  All questions were answered. The patient knows to call the clinic with any problems, questions or concerns.  Medical decision making: Moderate  Time spent on visit: I spent 20 minutes counseling the patient face to face. The total time spent in the appointment was 30 minutes and more than 50% was on counseling.  Pleasant Walter Barefoot, PA-C  03/17/24 10:06 AM

## 2024-03-17 ENCOUNTER — Inpatient Hospital Stay (HOSPITAL_BASED_OUTPATIENT_CLINIC_OR_DEPARTMENT_OTHER): Admitting: Physician Assistant

## 2024-03-17 VITALS — BP 123/75 | HR 61 | Temp 98.2°F | Resp 16 | Wt 200.2 lb

## 2024-03-17 DIAGNOSIS — D472 Monoclonal gammopathy: Secondary | ICD-10-CM | POA: Diagnosis not present

## 2024-03-17 NOTE — Patient Instructions (Signed)
 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   Your labs showed stable levels of M-protein related to your smoldering myeloma. Your labs do not show any evidence of progression to multiple myeloma cancer at this time. We will continue to monitor closely with repeat labs and skeletal survey (whole-body x-rays) in 6 months.  FOLLOW-UP APPOINTMENT: 6 months  ** Thank you for trusting me with your healthcare!  I strive to provide all of my patients with quality care at each visit.  If you receive a survey for this visit, I would be so grateful to you for taking the time to provide feedback.  Thank you in advance!  ~ Reyn Faivre                                        Dr. Mickiel Davonna Pleasant Barefoot, PA-C          Delon Hope, NP   - - - - - - - - - - - - - - - - - -    Thank you for choosing [facility] at [facility] to provide your oncology and hematology care.  To afford each patient quality time with our provider, please arrive at least 15 minutes before your scheduled appointment time.   If you have a lab appointment with the Cancer Center please come in thru the Main Entrance and check in at the main information desk.  You need to re-schedule your appointment should you arrive 10 or more minutes late.  We strive to give you quality time with our providers, and arriving late affects you and other patients whose appointments are after yours.  Also, if you no show three or more times for appointments you may be dismissed from the clinic at the providers discretion.     Again, thank you for choosing [facility].  Our hope is that these requests will decrease the amount of time that you wait before being seen by our physicians.       _____________________________________________________________  Should you have questions  after your visit to [facility], please contact our office at [phone] and follow the prompts.  Our office hours are 8:00 a.m. and 4:30 p.m. Monday - Friday.  Please note that voicemails left after 4:00 p.m. may not be returned until the following business day.  We are closed weekends and major holidays.  You do have access to a nurse 24-7, just call the main number to the clinic [phone] and do not press any options, hold on the line and a nurse will answer the phone.    For prescription refill requests, have your pharmacy contact our office and allow 72 hours.

## 2024-04-26 ENCOUNTER — Other Ambulatory Visit: Payer: Self-pay | Admitting: Internal Medicine

## 2024-05-04 NOTE — Progress Notes (Signed)
 Cardiology Office Note   Date:  05/12/2024   ID:  Walter Perez, DOB 12-27-1955, MRN [id number]  PCP:  Kathrene Mardy HERO, PA-C  Cardiologist:   Vina Gull, MD   Pt presents for f/u of CAD      History of Present Illness: Walter Perez is a 68 y.o. male with a history of CAD   -Nov 2020 Myoview showed anteroapical ischemia  Pt went on to have LHC showing 3 V CAD;  -Dec 2020 Pt underwent CABG x 4 (SVG to  PDA and distal lCx; RIMA to LAD and OM1 as Y graft from LIMA.  PDA endarterectomy)  Post op he had some Afib  Finished 30 days of AMiodarone     The pt also has a hx of HL, HTN, MBGUS   I saw the pt in March 2025  Since seen the pt says he is doing good   Walks a lot  WOrks at [employer] is good  No dizziness NO palpitations   Current Meds  Medication Sig   amLODipine  (NORVASC ) 5 MG tablet Take 1 tablet (5 mg total) by mouth daily.   aspirin 81 MG EC tablet Take 81 mg by mouth daily.    atorvastatin  (LIPITOR) 80 MG tablet Take 1 tablet (80 mg total) by mouth daily.   azelastine  (ASTELIN ) 0.1 % nasal spray Place 2 sprays into both nostrils 2 (two) times daily.   hydrocortisone 2.5 % cream as needed.   ketoconazole (NIZORAL) 2 % cream as needed.   metoprolol  succinate (TOPROL -XL) 100 MG 24 hr tablet TAKE 1 TABLET BY MOUTH ONCE DAILY WITH  OR  IMMEDIATELY  FOLLOWING  A  MEAL.   nitroGLYCERIN  (NITROSTAT ) 0.4 MG SL tablet Place 1 tablet (0.4 mg total) under the tongue every 5 (five) minutes as needed for chest pain.   olmesartan -hydrochlorothiazide (BENICAR  HCT) 40-25 MG tablet Take 1 tablet by mouth once daily   potassium chloride  SA (KLOR-CON  M) 20 MEQ tablet Take 1 tablet (20 mEq total) by mouth 2 (two) times daily. (Patient taking differently: Take 20 mEq by mouth 3 (three) times daily.)   triamcinolone  cream (KENALOG ) 0.1 % Apply 1 application topically daily as needed.   Current Facility-Administered Medications for the 05/12/24 encounter (Office Visit)  with Gull Vina GAILS, MD  Medication   triamcinolone  acetonide (KENALOG -40) injection 40 mg     Allergies:   Patient has no known allergies.   Past Medical History:  Diagnosis Date   Coronary artery disease    Hyperlipidemia    Hypertension    Myocardial infarction (HCC) 06/19/2019    Past Surgical History:  Procedure Laterality Date   CORONARY ARTERY BYPASS GRAFT  06/19/2019   quadruple     Social History:  The patient  reports that he has never smoked. He has never used smokeless tobacco. He reports that he does not drink alcohol and does not use drugs.   Family History:  The patient's family history includes Alzheimer's disease in his sister; Kidney disease in his brother; Stroke in his father and mother.    ROS:  Please see the history of present illness. All other systems are reviewed and  Negative to the above problem except as noted.    PHYSICAL EXAM: VS:  BP (!) 152/92 (BP Location: Left Arm, Patient Position: Sitting)   Pulse 63   Ht 5' 7 (1.702 m)   Wt 198 lb 9.6 oz (90.1 kg)   SpO2 98%  BMI 31.11 kg/m   BP was 148/88 GEN: OBese 68 yo n no acute distress  HEENT: normal  Neck: no JVD, Cardiac: RRR; no murmurs   Respiratory:  clear to auscultation GI: soft, nontender, No masses Ext  No edema   EKG:  EKG shows SR 63 bpm   Lipid Panel    Component Value Date/Time   CHOL 131 08/06/2020 1607   TRIG 140 08/06/2020 1607   HDL 40 08/06/2020 1607   CHOLHDL 3.3 08/06/2020 1607   LDLCALC 66 08/06/2020 1607      Wt Readings from Last 3 Encounters:  05/12/24 198 lb 9.6 oz (90.1 kg)  03/17/24 200 lb 2.8 oz (90.8 kg)  09/13/23 214 lb 11.7 oz (97.4 kg)      ASSESSMENT AND PLAN:  1  CAD Pt s/p CABG ins 2020 at [facility]    He denies symptoms sugg of angina  Follow   Rx risk factors    2 HL Last labs in 2024  WOuld repeat NMR panel     3  HTN   BP is controlled  at home (120s /)  and at other visits  [facility] here in cardiology    Follow   Keep log Keep on  current regimen for now  Call / write back if BP above 130s on average   4  Metabolics  Check A1 C and TSH    Reviewed diet    Stay active       Current medicines are reviewed at length with the patient today.  The patient does not have concerns regarding medicines.  Signed, Vina Gull, MD  05/12/2024 2:08 PM    [facility] [address] [place], [place], [place]  [address] Phone: [phone]; Fax: [phone]

## 2024-05-12 ENCOUNTER — Encounter: Payer: Self-pay | Admitting: Internal Medicine

## 2024-05-12 ENCOUNTER — Ambulatory Visit: Attending: Internal Medicine | Admitting: Internal Medicine

## 2024-05-12 VITALS — BP 152/92 | HR 63 | Ht 67.0 in | Wt 198.6 lb

## 2024-05-12 DIAGNOSIS — I10 Essential (primary) hypertension: Secondary | ICD-10-CM | POA: Diagnosis not present

## 2024-05-12 DIAGNOSIS — E782 Mixed hyperlipidemia: Secondary | ICD-10-CM

## 2024-05-12 DIAGNOSIS — I2510 Atherosclerotic heart disease of native coronary artery without angina pectoris: Secondary | ICD-10-CM | POA: Diagnosis not present

## 2024-05-12 DIAGNOSIS — Z79899 Other long term (current) drug therapy: Secondary | ICD-10-CM | POA: Diagnosis not present

## 2024-05-12 NOTE — Patient Instructions (Signed)
 Medication Instructions:  Your physician recommends that you continue on your current medications as directed. Please refer to the Current Medication list given to you today.  *If you need a refill on your cardiac medications before your next appointment, please call your pharmacy*  Lab Work: TODAY: NMR, Hgb A1c, TSH, vitamin D If you have labs (blood work) drawn today and your tests are completely normal, you will receive your results only by: MyChart Message (if you have MyChart) OR A paper copy in the mail If you have any lab test that is abnormal or we need to change your treatment, we will call you to review the results.  Follow-Up: At [facility], you and your health needs are our priority.  As part of our continuing mission to provide you with exceptional heart care, our providers are all part of one team.  This team includes your primary Cardiologist (physician) and Advanced Practice Providers or APPs (Physician Assistants and Nurse Practitioners) who all work together to provide you with the care you need, when you need it.  Your next appointment:   9 month(s)  Provider:   Vina Gull, MD   We recommend signing up for the patient portal called MyChart.  Sign up information is provided on this After Visit Summary.  MyChart is used to connect with patients for Virtual Visits (Telemedicine).  Patients are able to view lab/test results, encounter notes, upcoming appointments, etc.  Non-urgent messages can be sent to your provider as well.    To learn more about what you can do with MyChart, go to [email or web address].

## 2024-05-14 ENCOUNTER — Ambulatory Visit: Payer: Self-pay | Admitting: Internal Medicine

## 2024-05-14 DIAGNOSIS — E559 Vitamin D deficiency, unspecified: Secondary | ICD-10-CM

## 2024-05-23 LAB — NMR, LIPOPROFILE
Cholesterol, Total: 93 mg/dL — ABNORMAL LOW (ref 100–199)
HDL Particle Number: 22.7 umol/L — ABNORMAL LOW (ref >=30.5)
HDL-C: 32 mg/dL — ABNORMAL LOW (ref >39)
LDL Particle Number: 816 nmol/L (ref <1000)
LDL Size: 20.2 nm — ABNORMAL LOW (ref >20.5)
LDL-C (NIH Calc): 45 mg/dL (ref 0–99)
LP-IR Score: 49 — ABNORMAL HIGH (ref <=45)
Small LDL Particle Number: 424 nmol/L (ref <=527)
Triglycerides: 76 mg/dL (ref 0–149)

## 2024-05-23 LAB — HEMOGLOBIN A1C
Est. average glucose Bld gHb Est-mCnc: 126 mg/dL
Hgb A1c MFr Bld: 6 % — ABNORMAL HIGH (ref 4.8–5.6)

## 2024-05-23 LAB — TSH: TSH: 1.59 u[IU]/mL (ref 0.450–4.500)

## 2024-05-23 LAB — VITAMIN D, 25-HYDROXY, TOTAL: Vitamin D, 25-Hydroxy, Serum: 18 ng/mL — ABNORMAL LOW

## 2024-06-04 ENCOUNTER — Other Ambulatory Visit: Payer: Self-pay | Admitting: Internal Medicine

## 2024-06-22 ENCOUNTER — Other Ambulatory Visit: Payer: Self-pay | Admitting: Internal Medicine

## 2024-09-15 ENCOUNTER — Inpatient Hospital Stay

## 2024-09-22 ENCOUNTER — Ambulatory Visit: Admitting: Physician Assistant
